# Patient Record
Sex: Female | Born: 1975 | Race: Black or African American | Hispanic: No | Marital: Single | Smoking: Current some day smoker
Health system: Southern US, Community
[De-identification: ages and names within clinical notes are randomized; demographics above are authoritative.]

## PROBLEM LIST (undated history)

## (undated) DIAGNOSIS — F32A Depression, unspecified: Secondary | ICD-10-CM

## (undated) DIAGNOSIS — F419 Anxiety disorder, unspecified: Secondary | ICD-10-CM

## (undated) DIAGNOSIS — R350 Frequency of micturition: Secondary | ICD-10-CM

## (undated) DIAGNOSIS — E049 Nontoxic goiter, unspecified: Secondary | ICD-10-CM

## (undated) DIAGNOSIS — L02415 Cutaneous abscess of right lower limb: Secondary | ICD-10-CM

## (undated) DIAGNOSIS — D649 Anemia, unspecified: Secondary | ICD-10-CM

## (undated) DIAGNOSIS — I10 Essential (primary) hypertension: Secondary | ICD-10-CM

## (undated) DIAGNOSIS — R000 Tachycardia, unspecified: Secondary | ICD-10-CM

## (undated) DIAGNOSIS — L02416 Cutaneous abscess of left lower limb: Secondary | ICD-10-CM

## (undated) DIAGNOSIS — D219 Benign neoplasm of connective and other soft tissue, unspecified: Secondary | ICD-10-CM

## (undated) HISTORY — PX: PILONIDAL CYST EXCISION: SHX744

## (undated) HISTORY — PX: BIOPSY THYROID: PRO38

---

## 1997-09-03 ENCOUNTER — Other Ambulatory Visit: Admission: RE | Admit: 1997-09-03 | Discharge: 1997-09-03 | Payer: Self-pay | Admitting: *Deleted

## 1998-01-25 ENCOUNTER — Inpatient Hospital Stay (HOSPITAL_COMMUNITY): Admission: AD | Admit: 1998-01-25 | Discharge: 1998-01-30 | Payer: Self-pay | Admitting: *Deleted

## 1998-10-04 ENCOUNTER — Ambulatory Visit (HOSPITAL_COMMUNITY): Admission: RE | Admit: 1998-10-04 | Discharge: 1998-10-04 | Payer: Self-pay | Admitting: Family Medicine

## 1998-10-05 ENCOUNTER — Encounter: Payer: Self-pay | Admitting: Family Medicine

## 1998-11-16 ENCOUNTER — Other Ambulatory Visit: Admission: RE | Admit: 1998-11-16 | Discharge: 1998-11-16 | Payer: Self-pay | Admitting: Family Medicine

## 1999-02-17 ENCOUNTER — Encounter (HOSPITAL_BASED_OUTPATIENT_CLINIC_OR_DEPARTMENT_OTHER): Payer: Self-pay | Admitting: General Surgery

## 1999-02-21 ENCOUNTER — Encounter (INDEPENDENT_AMBULATORY_CARE_PROVIDER_SITE_OTHER): Payer: Self-pay | Admitting: *Deleted

## 1999-02-21 ENCOUNTER — Ambulatory Visit (HOSPITAL_COMMUNITY): Admission: RE | Admit: 1999-02-21 | Discharge: 1999-02-21 | Payer: Self-pay | Admitting: General Surgery

## 2001-07-11 ENCOUNTER — Other Ambulatory Visit: Admission: RE | Admit: 2001-07-11 | Discharge: 2001-07-11 | Payer: Self-pay | Admitting: Family Medicine

## 2002-07-07 ENCOUNTER — Ambulatory Visit (HOSPITAL_COMMUNITY): Admission: RE | Admit: 2002-07-07 | Discharge: 2002-07-07 | Payer: Self-pay | Admitting: Obstetrics & Gynecology

## 2002-07-07 ENCOUNTER — Encounter: Payer: Self-pay | Admitting: Obstetrics & Gynecology

## 2002-09-18 ENCOUNTER — Encounter: Payer: Self-pay | Admitting: Obstetrics & Gynecology

## 2002-09-18 ENCOUNTER — Ambulatory Visit (HOSPITAL_COMMUNITY): Admission: RE | Admit: 2002-09-18 | Discharge: 2002-09-18 | Payer: Self-pay | Admitting: Obstetrics & Gynecology

## 2007-04-19 ENCOUNTER — Other Ambulatory Visit: Admission: RE | Admit: 2007-04-19 | Discharge: 2007-04-19 | Payer: Self-pay | Admitting: Family Medicine

## 2008-07-20 ENCOUNTER — Emergency Department (HOSPITAL_COMMUNITY): Admission: EM | Admit: 2008-07-20 | Discharge: 2008-07-20 | Payer: Self-pay | Admitting: Emergency Medicine

## 2008-12-28 ENCOUNTER — Emergency Department (HOSPITAL_COMMUNITY): Admission: EM | Admit: 2008-12-28 | Discharge: 2008-12-28 | Payer: Self-pay | Admitting: Emergency Medicine

## 2009-02-17 ENCOUNTER — Other Ambulatory Visit: Admission: RE | Admit: 2009-02-17 | Discharge: 2009-02-17 | Payer: Self-pay | Admitting: Family Medicine

## 2009-11-03 ENCOUNTER — Encounter: Admission: RE | Admit: 2009-11-03 | Discharge: 2009-11-03 | Payer: Self-pay | Admitting: Family Medicine

## 2009-12-31 ENCOUNTER — Emergency Department (HOSPITAL_COMMUNITY)
Admission: EM | Admit: 2009-12-31 | Discharge: 2010-01-01 | Payer: Self-pay | Source: Home / Self Care | Admitting: Emergency Medicine

## 2010-03-03 ENCOUNTER — Emergency Department (HOSPITAL_COMMUNITY)
Admission: EM | Admit: 2010-03-03 | Discharge: 2010-03-03 | Disposition: A | Discharge status: Home / Self Care | Payer: Self-pay | Attending: Emergency Medicine | Admitting: Emergency Medicine

## 2010-03-03 DIAGNOSIS — I10 Essential (primary) hypertension: Secondary | ICD-10-CM | POA: Insufficient documentation

## 2010-03-03 DIAGNOSIS — L0231 Cutaneous abscess of buttock: Secondary | ICD-10-CM | POA: Insufficient documentation

## 2010-03-10 ENCOUNTER — Emergency Department (HOSPITAL_COMMUNITY)
Admission: EM | Admit: 2010-03-10 | Discharge: 2010-03-11 | Disposition: A | Discharge status: Home / Self Care | Payer: Self-pay | Attending: Emergency Medicine | Admitting: Emergency Medicine

## 2010-03-10 DIAGNOSIS — N898 Other specified noninflammatory disorders of vagina: Secondary | ICD-10-CM | POA: Insufficient documentation

## 2010-03-10 DIAGNOSIS — N949 Unspecified condition associated with female genital organs and menstrual cycle: Secondary | ICD-10-CM | POA: Insufficient documentation

## 2010-06-10 NOTE — Op Note (Signed)
[place]  Patient:    Nancy Valenzuela                            MRN: [id number] Proc. Date: 02/21/99 Adm. Date:  [id number] Attending:  Sonda Primes CC:         Mardene Celeste. Lurene Shadow, M.D. x 2                           Operative Report  PREOPERATIVE DIAGNOSIS:  Recurrent pilonidal abscesses.  POSTOPERATIVE DIAGNOSIS:  Recurrent pilonidal abscesses.  PROCEDURE:  Excision and marsupialization of pilonidal cyst.  SURGEON:  Luisa Hart L. Lurene Shadow, M.D.  ASSISTANT:  Damita Lack, P.A. student.  ANESTHESIA:  General.  INDICATIONS:  This patient is a 35 year old woman with approximately eight recurrent pilonidal abscesses, all of which have been I&Dd.  She comes to the operating room now for excision of her pilonidal cyst.  DESCRIPTION OF PROCEDURE:  Following the induction of anesthesia, the patient was then turned into the prone position and then jack knife.  The presacral area was prepped and draped to be included in the sterile operative field after the buttocks had been spread apart.  I then made an elliptical incision around the entire pilonidal area, and carrying the dissection straight down to the presacral fascia on all sides, and then excising the entire area off of the presacral fascia and  forwarding it for pathologic evaluation.  Hemostasis was assured with electrocautery, and then buttock skin was then sutured down to the presacral fascia using interrupted 0 chromic catgut sutures.  At the end of the procedure, all areas of dissection were noted to be dry. Sponge, instrument, and sharp counts were verified.  I packed a Xeroform gauze into the  remaining defect and covered this with  sterile dressings.  The anesthetic was then reversed and the patient removed from the operating room to the recovery room in stable condition, having tolerated the procedure well. DD:  02/21/99 TD:  02/21/99 Job: [id number]

## 2010-07-24 ENCOUNTER — Emergency Department (HOSPITAL_COMMUNITY): Payer: Self-pay

## 2010-07-24 ENCOUNTER — Emergency Department (HOSPITAL_COMMUNITY)
Admission: EM | Admit: 2010-07-24 | Discharge: 2010-07-24 | Disposition: A | Discharge status: Home / Self Care | Payer: Self-pay | Attending: Emergency Medicine | Admitting: Emergency Medicine

## 2010-07-24 DIAGNOSIS — M79609 Pain in unspecified limb: Secondary | ICD-10-CM | POA: Insufficient documentation

## 2010-07-24 DIAGNOSIS — S93609A Unspecified sprain of unspecified foot, initial encounter: Secondary | ICD-10-CM | POA: Insufficient documentation

## 2010-07-24 DIAGNOSIS — IMO0002 Reserved for concepts with insufficient information to code with codable children: Secondary | ICD-10-CM | POA: Insufficient documentation

## 2010-07-24 DIAGNOSIS — X500XXA Overexertion from strenuous movement or load, initial encounter: Secondary | ICD-10-CM | POA: Insufficient documentation

## 2010-07-24 DIAGNOSIS — I10 Essential (primary) hypertension: Secondary | ICD-10-CM | POA: Insufficient documentation

## 2010-07-24 DIAGNOSIS — Y9229 Other specified public building as the place of occurrence of the external cause: Secondary | ICD-10-CM | POA: Insufficient documentation

## 2010-07-24 DIAGNOSIS — S8990XA Unspecified injury of unspecified lower leg, initial encounter: Secondary | ICD-10-CM | POA: Insufficient documentation

## 2010-07-24 DIAGNOSIS — Y9341 Activity, dancing: Secondary | ICD-10-CM | POA: Insufficient documentation

## 2010-07-24 DIAGNOSIS — Z79899 Other long term (current) drug therapy: Secondary | ICD-10-CM | POA: Insufficient documentation

## 2010-10-22 ENCOUNTER — Emergency Department (HOSPITAL_COMMUNITY)
Admission: EM | Admit: 2010-10-22 | Discharge: 2010-10-22 | Disposition: A | Discharge status: Home / Self Care | Payer: Self-pay | Attending: Emergency Medicine | Admitting: Emergency Medicine

## 2010-10-22 ENCOUNTER — Emergency Department (HOSPITAL_COMMUNITY): Payer: Self-pay

## 2010-10-22 DIAGNOSIS — Z79899 Other long term (current) drug therapy: Secondary | ICD-10-CM | POA: Insufficient documentation

## 2010-10-22 DIAGNOSIS — F17200 Nicotine dependence, unspecified, uncomplicated: Secondary | ICD-10-CM | POA: Insufficient documentation

## 2010-10-22 DIAGNOSIS — W06XXXA Fall from bed, initial encounter: Secondary | ICD-10-CM | POA: Insufficient documentation

## 2010-10-22 DIAGNOSIS — IMO0002 Reserved for concepts with insufficient information to code with codable children: Secondary | ICD-10-CM | POA: Insufficient documentation

## 2011-02-21 ENCOUNTER — Other Ambulatory Visit: Payer: Self-pay | Admitting: Family Medicine

## 2011-02-21 ENCOUNTER — Other Ambulatory Visit (HOSPITAL_COMMUNITY)
Admission: RE | Admit: 2011-02-21 | Discharge: 2011-02-21 | Disposition: A | Discharge status: Home / Self Care | Payer: Commercial Indemnity | Source: Ambulatory Visit | Attending: Family Medicine | Admitting: Family Medicine

## 2011-02-21 DIAGNOSIS — Z1159 Encounter for screening for other viral diseases: Secondary | ICD-10-CM | POA: Insufficient documentation

## 2011-02-21 DIAGNOSIS — Z01419 Encounter for gynecological examination (general) (routine) without abnormal findings: Secondary | ICD-10-CM | POA: Insufficient documentation

## 2011-03-21 ENCOUNTER — Other Ambulatory Visit: Payer: Self-pay | Admitting: Internal Medicine

## 2011-03-21 DIAGNOSIS — E041 Nontoxic single thyroid nodule: Secondary | ICD-10-CM

## 2011-03-22 ENCOUNTER — Ambulatory Visit
Admission: RE | Admit: 2011-03-22 | Discharge: 2011-03-22 | Disposition: A | Discharge status: Home / Self Care | Payer: Commercial Indemnity | Source: Ambulatory Visit | Attending: Internal Medicine | Admitting: Internal Medicine

## 2011-03-22 ENCOUNTER — Other Ambulatory Visit (HOSPITAL_COMMUNITY)
Admission: RE | Admit: 2011-03-22 | Discharge: 2011-03-22 | Disposition: A | Discharge status: Home / Self Care | Payer: Commercial Indemnity | Source: Ambulatory Visit | Attending: Interventional Radiology | Admitting: Interventional Radiology

## 2011-03-22 DIAGNOSIS — E049 Nontoxic goiter, unspecified: Secondary | ICD-10-CM | POA: Insufficient documentation

## 2011-03-22 DIAGNOSIS — E041 Nontoxic single thyroid nodule: Secondary | ICD-10-CM

## 2011-07-26 ENCOUNTER — Emergency Department (HOSPITAL_COMMUNITY)
Admission: EM | Admit: 2011-07-26 | Discharge: 2011-07-26 | Disposition: A | Discharge status: Home / Self Care | Payer: Commercial Indemnity | Source: Home / Self Care | Attending: Emergency Medicine | Admitting: Emergency Medicine

## 2011-07-26 ENCOUNTER — Encounter (HOSPITAL_COMMUNITY): Payer: Self-pay

## 2011-07-26 DIAGNOSIS — L0231 Cutaneous abscess of buttock: Secondary | ICD-10-CM

## 2011-07-26 HISTORY — DX: Essential (primary) hypertension: I10

## 2011-07-26 MED ORDER — DOXYCYCLINE HYCLATE 100 MG PO CAPS: 100.0000 mg | ORAL_CAPSULE | Freq: Two times a day (BID) | ORAL | Status: AC

## 2011-07-26 NOTE — ED Provider Notes (Addendum)
History     CSN: [id number]  Arrival date & time 07/26/11  1451   First MD Initiated Contact with Patient 07/26/11 1507      Chief Complaint  Patient presents with  . Recurrent Skin Infections    (Consider location/radiation/quality/duration/timing/severity/associated sxs/prior treatment) HPI Comments: Patient presents urgent care complaining of an ongoing boil on her left buttock since Friday. Have been applying warm compresses and: Sitz baths has not drained. It is getting bigger and more tender. She describes she has had many of this pulse before and she had on several occasion have to have been drained as well. Patient denies any systemic symptoms such as fevers, body aches arthritis or myalgias or changes in appetite.  The history is provided by the patient.    Past Medical History  Diagnosis Date  . Hypertension     Past Surgical History  Procedure Date  . Pilonidal cyst excision     History reviewed. No pertinent family history.  History  Substance Use Topics  . Smoking status: Current Everyday Smoker -- 1.0 packs/[place]  . Smokeless tobacco: Not on file  . Alcohol Use: No    OB History    Grav Para Term Preterm Abortions TAB SAB Ect Mult Living                  Review of Systems  Constitutional: Negative for fever, chills, activity change and fatigue.  Skin: Negative for color change, pallor, rash and wound.    Allergies  Review of patient's allergies indicates no known allergies.  Home Medications   Current Outpatient Rx  Name Route Sig Dispense Refill  . TRIBENZOR PO Oral Take by mouth daily.    Marland Kitchen DOXYCYCLINE HYCLATE 100 MG PO CAPS Oral Take 1 capsule (100 mg total) by mouth 2 (two) times daily. 20 capsule 0    BP 129/80  Pulse 97  Temp 98.5 F (36.9 C) (Oral)  Resp 16  SpO2 100%  LMP 07/05/2011  Physical Exam  Nursing note and vitals reviewed. Constitutional: She appears well-developed and well-nourished.  Abdominal: She exhibits no  distension. There is no tenderness.  Musculoskeletal: She exhibits tenderness.  Skin: No rash noted.       ED Course  INCISION AND DRAINAGE Performed by: Tascha Casares Authorized by: Jimmie Molly Consent: Verbal consent obtained. Consent given by: patient Patient understanding: patient states understanding of the procedure being performed Patient identity confirmed: verbally with patient Type: abscess Body area: anogenital Location details: gluteal cleft Anesthesia: local infiltration Local anesthetic: lidocaine 2% with epinephrine Anesthetic total: 6 ml Scalpel size: 15 Complexity: simple Drainage: purulent Packing material: 1/4 in iodoform gauze   (including critical care time)   Labs Reviewed  CULTURE, ROUTINE-ABSCESS   No results found.   1. Abscess of left buttock       MDM  Uncomplicated left intergluteal abscess. Incision and drainage sample obtained for cultures. Patient instructed to return in 48 hours for recheck and packing removal started on doxycycline. Patient understand treatment plans and agrees with follow-up.        Jimmie Molly, MD 07/26/11 1655   Jimmie Molly, MD 07/26/11 2016

## 2011-07-26 NOTE — ED Notes (Signed)
C/o boil to lt buttocks since Friday.  Has been using sitz baths and warm compresses.  Has not drained.

## 2011-07-28 LAB — CULTURE, ROUTINE-ABSCESS

## 2011-08-01 NOTE — ED Notes (Signed)
Abscess culture L buttocks: Few staph. species (coagulase negative).  Pt. had an I and D and Rx. of Doxycycline.  Shown to Dr. Tressia Danas and she said it was OK. Nancy Valenzuela 08/01/2011

## 2012-06-16 IMAGING — CR DG ANKLE COMPLETE 3+V*L*
3 series · 3 of 3 positions shown · non-contrast
Comparison: None.

CLINICAL DATA: Pain.  No trauma history submitted.

LEFT ANKLE COMPLETE - 3+ VIEW

[t ankle joint ap left]
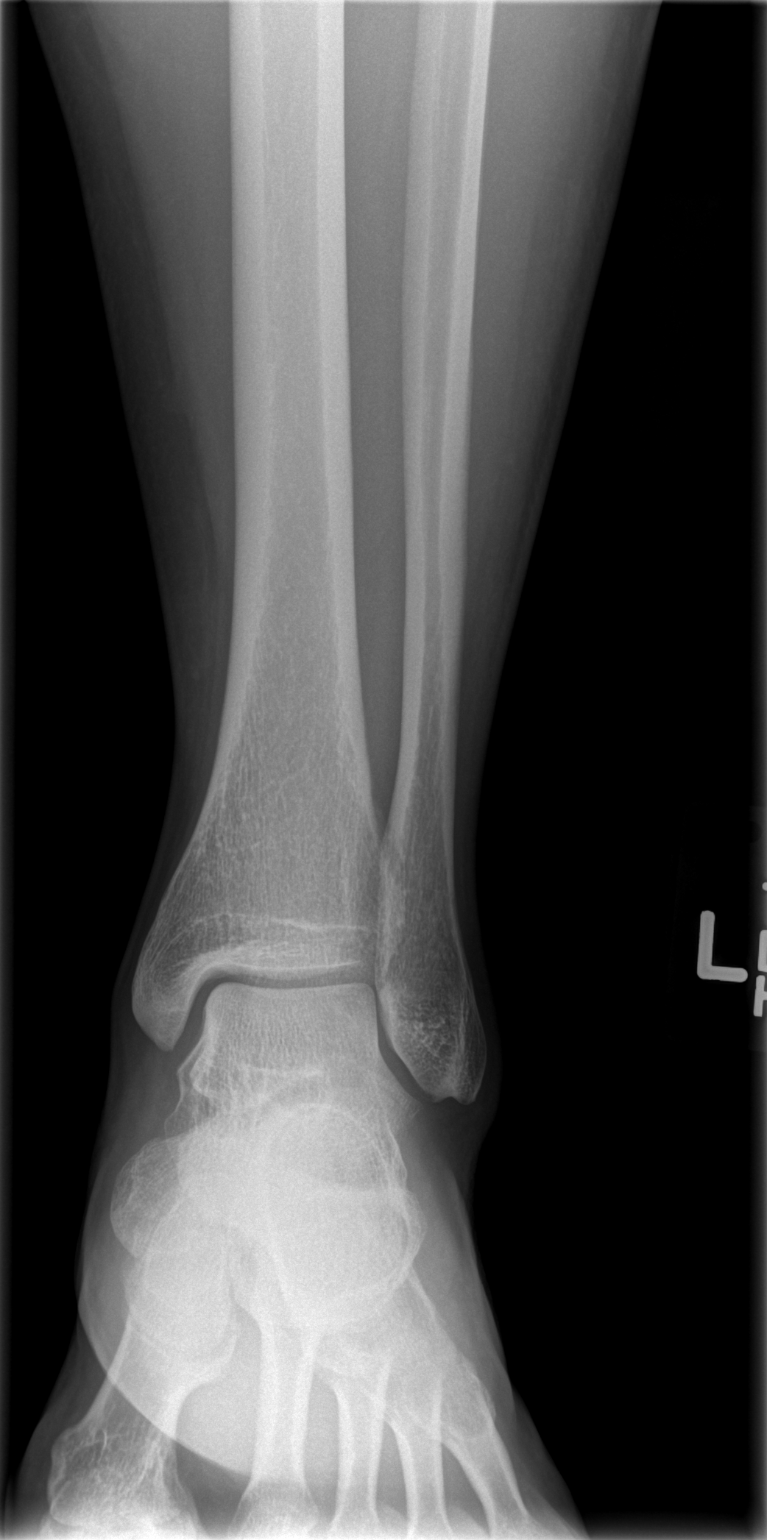

[t ankle joint oblique left]
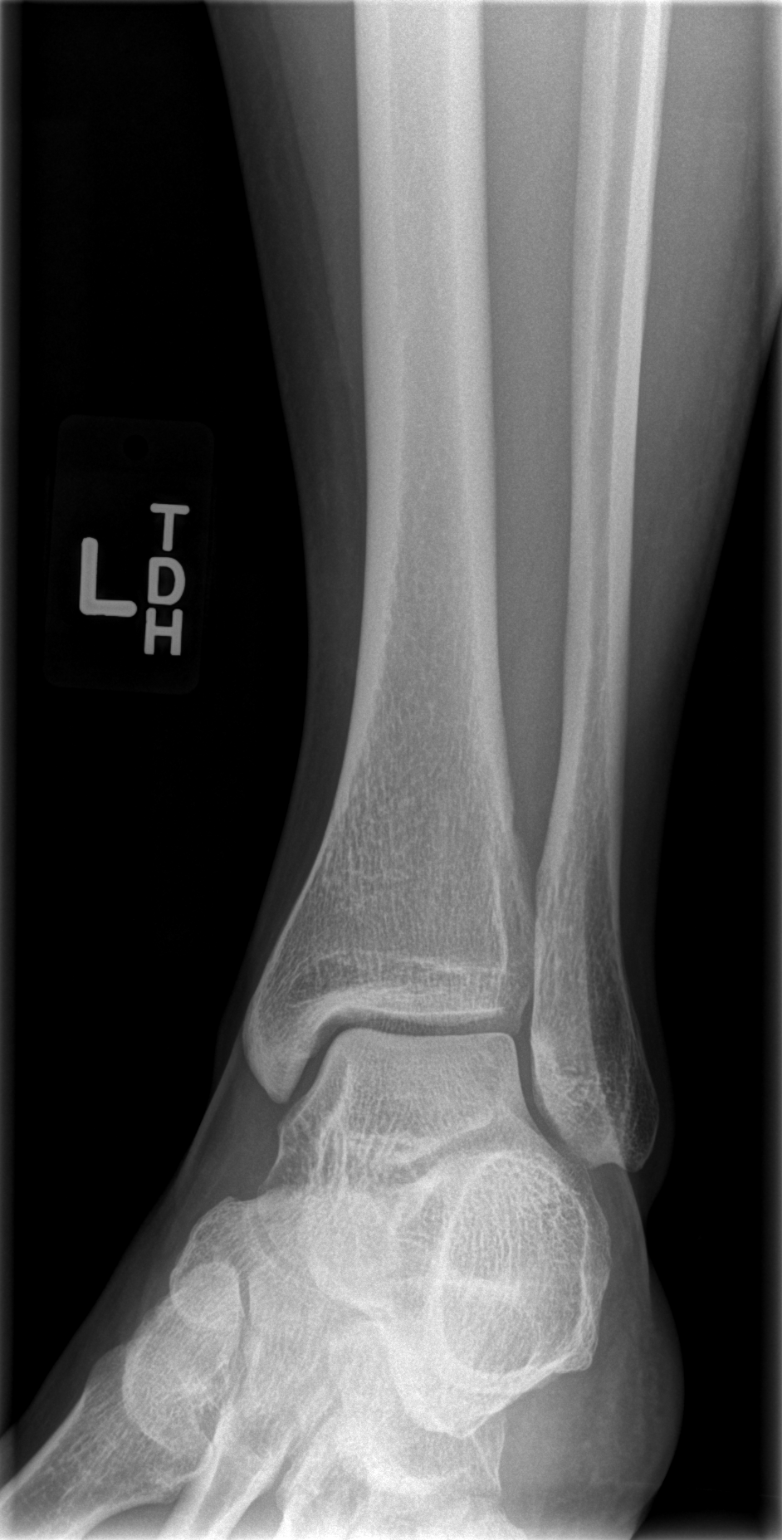

[t ankle joint lat left]
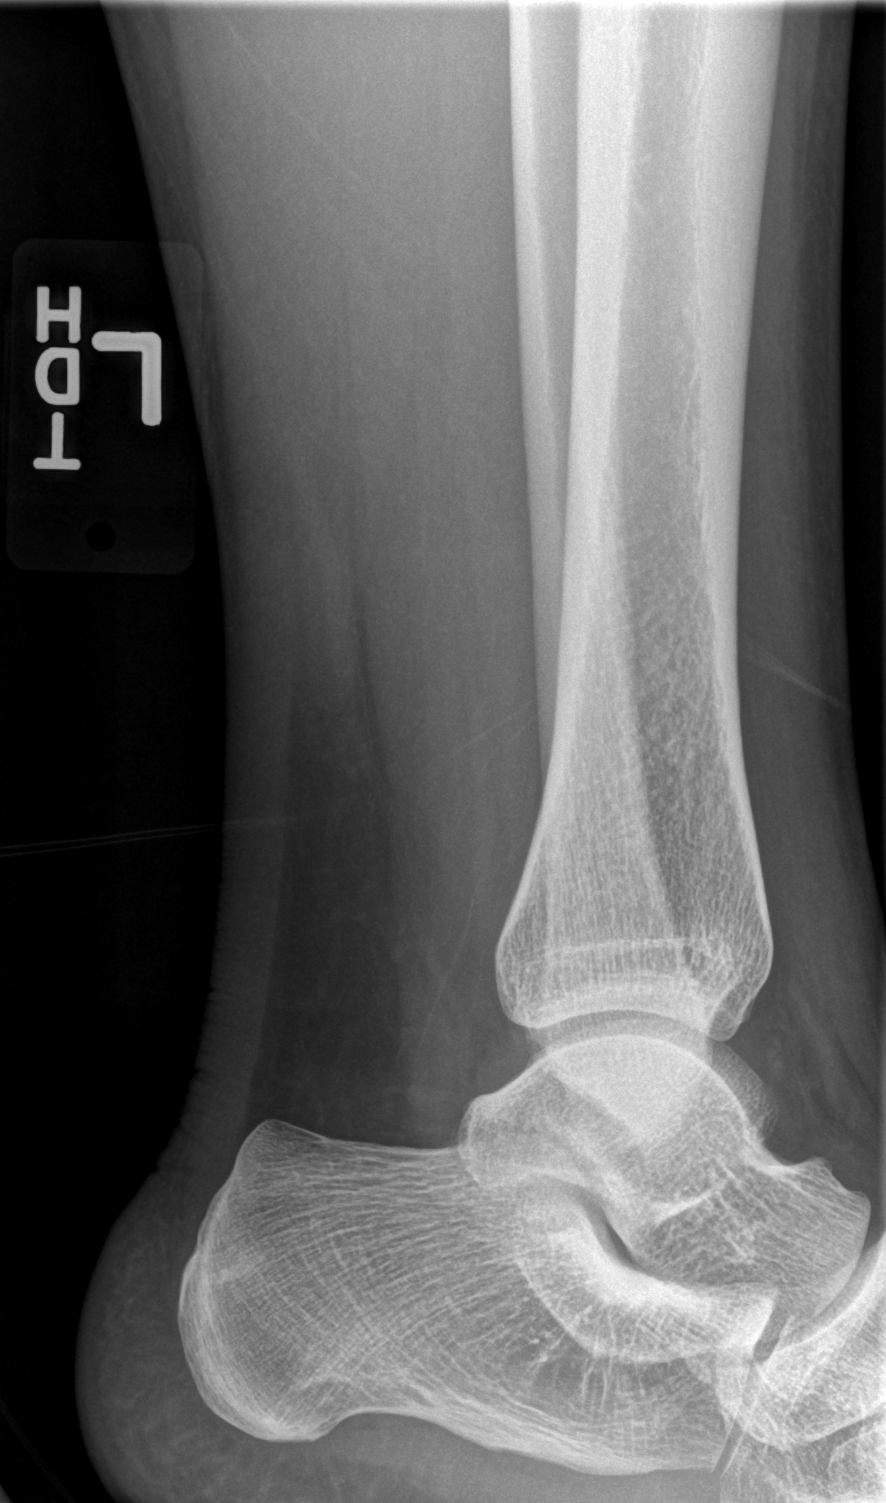

[3 of 3 positions shown; findings below may reference images not displayed]

FINDINGS: No significant soft tissue swelling.  Minimal
osteoarthritis involving the lateral aspect of the ankle. No acute
fracture or dislocation.
IMPRESSION: No acute findings about the left ankle.

## 2012-06-19 ENCOUNTER — Other Ambulatory Visit (HOSPITAL_COMMUNITY)
Admission: RE | Admit: 2012-06-19 | Discharge: 2012-06-19 | Disposition: A | Discharge status: Home / Self Care | Payer: No Typology Code available for payment source | Source: Ambulatory Visit | Attending: Family Medicine | Admitting: Family Medicine

## 2012-06-19 ENCOUNTER — Ambulatory Visit: Payer: No Typology Code available for payment source | Attending: Family Medicine | Admitting: Family Medicine

## 2012-06-19 VITALS — BP 121/87 | HR 101 | Temp 98.7°F | Resp 18 | Wt 180.6 lb

## 2012-06-19 DIAGNOSIS — N760 Acute vaginitis: Secondary | ICD-10-CM | POA: Insufficient documentation

## 2012-06-19 DIAGNOSIS — N898 Other specified noninflammatory disorders of vagina: Secondary | ICD-10-CM | POA: Insufficient documentation

## 2012-06-19 DIAGNOSIS — Z113 Encounter for screening for infections with a predominantly sexual mode of transmission: Secondary | ICD-10-CM | POA: Insufficient documentation

## 2012-06-19 DIAGNOSIS — Z7251 High risk heterosexual behavior: Secondary | ICD-10-CM

## 2012-06-19 LAB — POCT URINE PREGNANCY: Preg Test, Ur: NEGATIVE

## 2012-06-19 NOTE — Progress Notes (Signed)
Subjective:     Patient ID: Nancy Valenzuela, female   DOB: 1975/11/25, 37 y.o.   MRN: [id number]  HPI Pt here to establish care. She has several concerns today including blood pressure, vaginal discharge with odor, tobacco abuse, and the need for health maint exam.   Discussed that today we are able to address one concern and we are happy to see her next week to discuss her other concerns. She is ammenable to this and would like to deal with her vaginal discharge. She denies new sexual partners although is not 100% certain that her boyfriend has not had new partners. Therefore she would like std testing. Her dicharge has been present for a week and smells slightly fishy. She has a rash but no itching. She applied allegra cream and it burned. She has no urinary sx.    Review of Systems per hpi     Objective:   Physical Exam  Cardiovascular: Normal rate.   Pulmonary/Chest: Effort normal.  Genitourinary: Vagina normal.  Creamy d/c, no odor, no rash  no adnexal ttp, neg chandelier sign     Assessment:     Vaginal discharge - Plan: Wet prep, genital, KOH prep, GC/Chlamydia Amp Probe, Genital  High risk sexual behavior - Plan: GC/Chlamydia Amp Probe, Genital, RPR, Acute Hep Panel & Hep B Surface Ab, HIV Antibody, POCT urine pregnancy       Plan:     Will f/u labs and call in meds if indicated Will see her back in 1-2 weeks for f/u chronic probs

## 2012-06-19 NOTE — Progress Notes (Signed)
Patient here to establish care History of HTN Needs referral for pap smear

## 2012-06-19 NOTE — Progress Notes (Signed)
Patient left clinic before RPR, Acute Hep Panel & Hep B surface Ab, and HIV antibody could be drawn.  Have informed [facility] to cancel lab work.

## 2012-06-20 ENCOUNTER — Telehealth: Payer: Self-pay | Admitting: Family Medicine

## 2012-06-20 DIAGNOSIS — Z7253 High risk bisexual behavior: Secondary | ICD-10-CM | POA: Insufficient documentation

## 2012-06-20 LAB — KOH PREP: RESULT - KOH: NONE SEEN

## 2012-06-20 NOTE — Telephone Encounter (Signed)
Nancy Valenzuela from [facility] called saying that she received an order yesterday made by Dr. Lucretia Roers for this patient but that in Epic it was put in as a [facility] order and in order to process it they would need the order to be put in correctly.  She is waiting call back.  Thanks

## 2012-06-26 ENCOUNTER — Ambulatory Visit: Payer: No Typology Code available for payment source | Attending: Family Medicine | Admitting: Internal Medicine

## 2012-06-26 ENCOUNTER — Encounter: Payer: Self-pay | Admitting: Internal Medicine

## 2012-06-26 VITALS — BP 120/86 | HR 94 | Temp 98.4°F | Resp 17 | Wt 178.2 lb

## 2012-06-26 DIAGNOSIS — I10 Essential (primary) hypertension: Secondary | ICD-10-CM

## 2012-06-26 LAB — BASIC METABOLIC PANEL
CO2: 23 mEq/L (ref 19–32)
Creat: 0.62 mg/dL (ref 0.50–1.10)
Sodium: 139 mEq/L (ref 135–145)

## 2012-06-26 LAB — LIPID PANEL
LDL Cholesterol: 140 mg/dL — ABNORMAL HIGH (ref 0–99)
VLDL: 11 mg/dL (ref 0–40)

## 2012-06-26 MED ORDER — PHENTERMINE HCL 37.5 MG PO CAPS: 37.5000 mg | ORAL_CAPSULE | ORAL | Status: DC

## 2012-06-26 MED ORDER — VARENICLINE TARTRATE 1 MG PO TABS: 1.0000 mg | ORAL_TABLET | Freq: Two times a day (BID) | ORAL | Status: DC

## 2012-06-26 MED ORDER — LISINOPRIL-HYDROCHLOROTHIAZIDE 20-25 MG PO TABS: 1.0000 | ORAL_TABLET | Freq: Every day | ORAL | Status: DC

## 2012-06-26 NOTE — Patient Instructions (Signed)

## 2012-06-26 NOTE — Progress Notes (Signed)
Patient ID: Nancy Valenzuela, female   DOB: Apr 05, 1975, 37 y.o.   MRN: [id number]  CC: refill on medications  HPI: Pt is 37 yo female presents to clinic with main concern of running out the medications, needs refill on all medications. She denies chest pain or shortness of breath, no recent sicknesses or hospitalizations. She reports taking medications regularly. No abdominal or urinary concerns.  No Known Allergies Past Medical History  Diagnosis Date  . Hypertension    No current outpatient prescriptions on file prior to visit.   No current facility-administered medications on file prior to visit.   No family history on file. History   Social History  . Marital Status: Single    Spouse Name: N/A    Number of Children: N/A  . Years of Education: N/A   Occupational History  . Not on file.   Social History Main Topics  . Smoking status: Current Every [place] Smoker -- 1.00 packs/[place]  . Smokeless tobacco: Not on file  . Alcohol Use: No  . Drug Use: No  . Sexually Active:    Other Topics Concern  . Not on file   Social History Narrative  . No narrative on file    Review of Systems  Constitutional: Negative for fever, chills, diaphoresis, activity change, appetite change and fatigue.  HENT: Negative for ear pain, nosebleeds, congestion, facial swelling, rhinorrhea, neck pain, neck stiffness and ear discharge.   Eyes: Negative for pain, discharge, redness, itching and visual disturbance.  Respiratory: Negative for cough, choking, chest tightness, shortness of breath, wheezing and stridor.   Cardiovascular: Negative for chest pain, palpitations and leg swelling.  Gastrointestinal: Negative for abdominal distention.  Genitourinary: Negative for dysuria, urgency, frequency, hematuria, flank pain, decreased urine volume, difficulty urinating and dyspareunia.  Musculoskeletal: Negative for back pain, joint swelling, arthralgias and gait problem.  Neurological: Negative for dizziness,  tremors, seizures, syncope, facial asymmetry, speech difficulty, weakness, light-headedness, numbness and headaches.  Hematological: Negative for adenopathy. Does not bruise/bleed easily.  Psychiatric/Behavioral: Negative for hallucinations, behavioral problems, confusion, dysphoric mood, decreased concentration and agitation.    Objective:   Filed Vitals:   06/26/12 1006  BP: 120/86  Pulse: 94  Temp: 98.4 F (36.9 C)  Resp: 17    Physical Exam  Constitutional: Appears well-developed and well-nourished. No distress.  HENT: Normocephalic. External right and left ear normal. Oropharynx is clear and moist.  Eyes: Conjunctivae and EOM are normal. PERRLA, no scleral icterus.  Neck: Normal ROM. Neck supple. No JVD. No tracheal deviation. No thyromegaly.  CVS: RRR, S1/S2 +, no murmurs, no gallops, no carotid bruit.  Pulmonary: Effort and breath sounds normal, no stridor, rhonchi, wheezes, rales.  Abdominal: Soft. BS +,  no distension, tenderness, rebound or guarding.  Musculoskeletal: Normal range of motion. No edema and no tenderness.  Lymphadenopathy: No lymphadenopathy noted, cervical, inguinal. Neuro: Alert. Normal reflexes, muscle tone coordination. No cranial nerve deficit. Skin: Skin is warm and dry. No rash noted. Not diaphoretic. No erythema. No pallor.  Psychiatric: Normal mood and affect. Behavior, judgment, thought content normal.   No results found for this basename: WBC, HGB, HCT, MCV, PLT   No results found for this basename: CREATININE, BUN, NA, K, CL, CO2    No results found for this basename: HGBA1C   Lipid Panel  No results found for this basename: chol, trig, hdl, cholhdl, vldl, ldlcalc       Assessment and plan:   Patient Active Problem List   Diagnosis Date Noted  .  HTN 06/20/2012   - pt unable to afford current blood pressure medication - I will start Lisinopril - HCTZ today - I have advised pt to continue checking her BP regularly and to call us back  if BP > 140/90 persistently - will check BMP and lipid panel today  - follow up in 3 months

## 2012-06-26 NOTE — Progress Notes (Signed)
Patient here for blood pressure medication refill

## 2012-07-04 ENCOUNTER — Ambulatory Visit: Payer: No Typology Code available for payment source | Attending: Family Medicine | Admitting: Family Medicine

## 2012-07-04 VITALS — BP 142/92 | HR 83 | Temp 98.2°F | Resp 18 | Ht 63.19 in | Wt 181.1 lb

## 2012-07-04 DIAGNOSIS — F17200 Nicotine dependence, unspecified, uncomplicated: Secondary | ICD-10-CM

## 2012-07-04 DIAGNOSIS — B353 Tinea pedis: Secondary | ICD-10-CM | POA: Insufficient documentation

## 2012-07-04 DIAGNOSIS — Z720 Tobacco use: Secondary | ICD-10-CM

## 2012-07-04 DIAGNOSIS — Z862 Personal history of diseases of the blood and blood-forming organs and certain disorders involving the immune mechanism: Secondary | ICD-10-CM

## 2012-07-04 DIAGNOSIS — Z7251 High risk heterosexual behavior: Secondary | ICD-10-CM

## 2012-07-04 DIAGNOSIS — Z8639 Personal history of other endocrine, nutritional and metabolic disease: Secondary | ICD-10-CM

## 2012-07-04 DIAGNOSIS — Z23 Encounter for immunization: Secondary | ICD-10-CM

## 2012-07-04 DIAGNOSIS — E049 Nontoxic goiter, unspecified: Secondary | ICD-10-CM | POA: Insufficient documentation

## 2012-07-04 DIAGNOSIS — Z0000 Encounter for general adult medical examination without abnormal findings: Secondary | ICD-10-CM

## 2012-07-04 DIAGNOSIS — I10 Essential (primary) hypertension: Secondary | ICD-10-CM | POA: Insufficient documentation

## 2012-07-04 LAB — RPR

## 2012-07-04 LAB — HIV ANTIBODY (ROUTINE TESTING W REFLEX): HIV: NONREACTIVE

## 2012-07-04 MED ORDER — MICONAZOLE NITRATE 2 % EX CREA: TOPICAL_CREAM | Freq: Two times a day (BID) | CUTANEOUS | Status: DC

## 2012-07-04 NOTE — Progress Notes (Signed)
Subjective:     Patient ID: Nancy Valenzuela, female   DOB: [birth year], 37 y.o.   MRN: [id number]  HPI Pt here today for f/u from initial visit, as we were not able to address all issues that Nancy Valenzuela. Since that time she has been to the clinic once for her BP meds and was prescribed zestoretic, but has not started it yet.   1 - HTN - P 142/92 today, no headaches, blurred vision or cp, has script for zestoretic.   2 - review labs from her last 2 visits - bmp wnl, ldl high and hdl low, apparently the blood was not drawn for her STD workup so rpr/hiv/hep not resulted  3 - burning, itchy rash on b/l feet  4 - health maint - pap - due, has had abnormal pap in the past - mammo - no FH breast cancer except for grandmother, who was elderly.  - tdap - unsure when last had - flu - did not have last year - pneumovax - is a smoker, has not had shot - zostavax - not yet of age - c-scope - ho FH of early colon cancer - dexa - no h/o fractures or steroid use - vit D - none on record - BP - as above - cholesterol screen - as above - tobacco - current smoker, has script for chantix and working on being able to afford taking it. No renal, CV, mental impairment known - no etoh, no drugs  5 - pt reports a h/o goiter for which she saw endicrine several years ago. She was supposed to f/u after her treatment but never did.   Review of Systems per hpi     Objective:   Physical Exam  Nursing note and vitals reviewed. Constitutional: She appears well-developed and well-nourished.  Cardiovascular: Normal rate, regular rhythm and normal heart sounds.   Pulmonary/Chest: Effort normal and breath sounds normal.  Skin: Skin is warm and dry.  Plantar surface of both feet, slightly scaly, red, evidence of excoriation, L>R  Psychiatric: She has a normal mood and affect.       Assessment:     Tinea pedis - Plan: miconazole (MICATIN) 2 % cream  Personal history of goiter - Plan: Ambulatory referral to  Endocrinology  Essential hypertension, benign  Tobacco abuse  High risk sexual behavior - Plan: RPR, HIV Antibody, Acute Hep Panel & Hep B Surface Ab  Routine health maintenance - Plan: Tdap vaccine greater than or equal to 7yo IM       Plan:     Tinea pedis - try miconazole, if this fails to resolve the fungal infection would consider scraping for definitive typing, consider oral meds  Goiter - refer to endo  HTN - start zestoretic,  - recheck bp next week when she follows up - next week check bmp as she will have been on new med for a week  Tobacco - encouraged her to d/c smoking. Spent 3-5 mins in counselling  STD concerns - drew blood today - on return visit, will need to go GC/chlam swabs as well as wet prep along with pap  Elevated cholesterol - discuss at next visit, try diet and lifestyle changes for 6 mos. If still elevated consider statin  Heath maint - pap - pt to rtc next week, plan pap with hpv, need records regarding past abnormal paps - mammo - start at age 50 - tdap - given today, will be due in 2024 - flu - will need  in the fall - pneumovax - if does not quit smoking should discuss getting this fall.  - zostavax - at age 82 - c-scope - ate age 89 - dexa - at age 23 - screening labs to be done next week  rtc next week for pap and gc/chlam, wet prep, BP check, screening and f/u labs: cmp, tsh, cbc, vit D, a1c,   After that rtc 3 mos f/u tinea pedis, htn  Will need appt 6 mos for flp +/- OV 1 year health maint

## 2012-07-04 NOTE — Patient Instructions (Signed)
Athlete's Foot Athlete's foot (tinea pedis) is a fungal infection of the skin on the feet. It often occurs on the skin between the toes or underneath the toes. It can also occur on the soles of the feet. Athlete's foot is more likely to occur in hot, humid weather. Not washing your feet or changing your socks often enough can contribute to athlete's foot. The infection can spread from person to person (contagious). CAUSES Athlete's foot is caused by a fungus. This fungus thrives in warm, moist places. Most people get athlete's foot by sharing shower stalls, towels, and wet floors with an infected person. People with weakened immune systems, including those with diabetes, may be more likely to get athlete's foot. SYMPTOMS   Itchy areas between the toes or on the soles of the feet.  White, flaky, or scaly areas between the toes or on the soles of the feet.  Tiny, intensely itchy blisters between the toes or on the soles of the feet.  Tiny cuts on the skin. These cuts can develop a bacterial infection.  Thick or discolored toenails. DIAGNOSIS  Your caregiver can usually tell what the problem is by doing a physical exam. Your caregiver may also take a skin sample from the rash area. The skin sample may be examined under a microscope, or it may be tested to see if fungus will grow in the sample. A sample may also be taken from your toenail for testing. TREATMENT  Over-the-counter and prescription medicines can be used to kill the fungus. These medicines are available as powders or creams. Your caregiver can suggest medicines for you. Fungal infections respond slowly to treatment. You may need to continue using your medicine for several weeks. PREVENTION   Do not share towels.  Wear sandals in wet areas, such as shared locker rooms and shared showers.  Keep your feet dry. Wear shoes that allow air to circulate. Wear cotton or wool socks. HOME CARE INSTRUCTIONS   Take medicines as directed by  your caregiver. Do not use steroid creams on athlete's foot.  Keep your feet clean and cool. Wash your feet daily and dry them thoroughly, especially between your toes.  Change your socks every [place]. Wear cotton or wool socks. In hot climates, you may need to change your socks 2 to 3 times per [place].  Wear sandals or canvas tennis shoes with good air circulation.  If you have blisters, soak your feet in Burow's solution or Epsom salts for 20 to 30 minutes, 2 times a [place] to dry out the blisters. Make sure you dry your feet thoroughly afterward. SEEK MEDICAL CARE IF:   You have a fever.  You have swelling, soreness, warmth, or redness in your foot.  You are not getting better after 7 days of treatment.  You are not completely cured after 30 days.  You have any problems caused by your medicines. MAKE SURE YOU:   Understand these instructions.  Will watch your condition.  Will get help right away if you are not doing well or get worse. Document Released: 01/07/2000 Document Revised: 04/03/2011 Document Reviewed: 10/28/2010 Exit[employer] Patient Information 2014 [employer].  

## 2012-07-04 NOTE — Progress Notes (Signed)
Pt is here for a f/u w/Dr. Joseph Art and to go over recent labs Pt has multiple concerns she would like to address today w/Dr. Joseph Art She is alert and oriented w/no signs of acute distress.

## 2012-07-05 LAB — ACUTE HEP PANEL AND HEP B SURFACE AB: Hepatitis B Surface Ag: NEGATIVE

## 2012-07-08 ENCOUNTER — Other Ambulatory Visit: Payer: Self-pay | Admitting: Family Medicine

## 2012-07-08 ENCOUNTER — Ambulatory Visit: Payer: No Typology Code available for payment source

## 2012-07-08 DIAGNOSIS — B9689 Other specified bacterial agents as the cause of diseases classified elsewhere: Secondary | ICD-10-CM

## 2012-07-08 MED ORDER — METRONIDAZOLE 500 MG PO TABS: 500.0000 mg | ORAL_TABLET | Freq: Two times a day (BID) | ORAL | Status: DC

## 2012-07-09 ENCOUNTER — Ambulatory Visit: Payer: No Typology Code available for payment source

## 2012-07-10 ENCOUNTER — Ambulatory Visit: Payer: No Typology Code available for payment source | Attending: Family Medicine

## 2012-07-10 ENCOUNTER — Telehealth: Payer: Self-pay | Admitting: *Deleted

## 2012-07-10 NOTE — Telephone Encounter (Signed)
07/10/12  Patient presented to clinic today made aware that Her prescription was called into [facility]  On [address] per pt. Request. P.Tiger Spieker,RN BSN [facility]

## 2012-07-10 NOTE — Telephone Encounter (Signed)
07/10/12 Prescription called into pharmacy for Flagyl per Dr. Lucretia Roers at  [facility] [phone].  Patient made aware.  P.Chauna Osoria,RN BSN MHA

## 2012-07-10 NOTE — Telephone Encounter (Signed)
07/10/12 Patient made aware that her HIV, RPR, Hepatitis test all negative. P.Makennah Omura,RN BSN  MHA

## 2012-07-17 ENCOUNTER — Ambulatory Visit: Payer: No Typology Code available for payment source

## 2012-08-26 ENCOUNTER — Ambulatory Visit: Payer: No Typology Code available for payment source | Attending: Family Medicine | Admitting: Internal Medicine

## 2012-08-26 MED ORDER — LISINOPRIL-HYDROCHLOROTHIAZIDE 20-25 MG PO TABS: 1.0000 | ORAL_TABLET | Freq: Every day | ORAL | Status: DC

## 2012-08-26 NOTE — Progress Notes (Unsigned)
Patient here for follow up of blood pressure

## 2012-08-26 NOTE — Progress Notes (Unsigned)
Patient ID: Nancy Valenzuela, female   DOB: Jun 13, 1975, 37 y.o.   MRN: [id number]  CC:  HPI:  37 year old female seen for followup of her hypertension. The patient's blood pressure is fairly well-controlled. She denies any chest pain any shortness of breath. The patient is requesting Chantix to help her quit smoking. She was prescribed this by Dr. Izola Price on 6/4 but has not been able to refill this medication for some reason.     No Known Allergies Past Medical History  Diagnosis Date  . Hypertension    Current Outpatient Prescriptions on File Prior to Visit  Medication Sig Dispense Refill  . metroNIDAZOLE (FLAGYL) 500 MG tablet Take 1 tablet (500 mg total) by mouth 2 (two) times daily with a meal.  14 tablet  0  . miconazole (MICATIN) 2 % cream Apply topically 2 (two) times daily.  85 g  1  . phentermine 37.5 MG capsule Take 1 capsule (37.5 mg total) by mouth every morning.  30 capsule  5  . varenicline (CHANTIX) 1 MG tablet Take 1 tablet (1 mg total) by mouth 2 (two) times daily.  60 tablet  3   No current facility-administered medications on file prior to visit.   History reviewed. No pertinent family history. History   Social History  . Marital Status: Single    Spouse Name: N/A    Number of Children: N/A  . Years of Education: N/A   Occupational History  . Not on file.   Social History Main Topics  . Smoking status: Current Every [place] Smoker -- 1.00 packs/[place]  . Smokeless tobacco: Not on file  . Alcohol Use: No  . Drug Use: No  . Sexually Active:    Other Topics Concern  . Not on file   Social History Narrative  . No narrative on file    Review of Systems  Constitutional: Negative for fever, chills, diaphoresis, activity change, appetite change and fatigue.  HENT: Negative for ear pain, nosebleeds, congestion, facial swelling, rhinorrhea, neck pain, neck stiffness and ear discharge.   Eyes: Negative for pain, discharge, redness, itching and visual disturbance.   Respiratory: Negative for cough, choking, chest tightness, shortness of breath, wheezing and stridor.   Cardiovascular: Negative for chest pain, palpitations and leg swelling.  Gastrointestinal: Negative for abdominal distention.  Genitourinary: Negative for dysuria, urgency, frequency, hematuria, flank pain, decreased urine volume, difficulty urinating and dyspareunia.  Musculoskeletal: Negative for back pain, joint swelling, arthralgias and gait problem.  Neurological: Negative for dizziness, tremors, seizures, syncope, facial asymmetry, speech difficulty, weakness, light-headedness, numbness and headaches.  Hematological: Negative for adenopathy. Does not bruise/bleed easily.  Psychiatric/Behavioral: Negative for hallucinations, behavioral problems, confusion, dysphoric mood, decreased concentration and agitation.    Objective:   Filed Vitals:   08/26/12 1003  BP: 129/86  Pulse: 95  Temp: 97 F (36.1 C)  Resp: 16    Physical Exam  Constitutional: Appears well-developed and well-nourished. No distress.  HENT: Normocephalic. External right and left ear normal. Oropharynx is clear and moist.  Eyes: Conjunctivae and EOM are normal. PERRLA, no scleral icterus.  Neck: Normal ROM. Neck supple. No JVD. No tracheal deviation. No thyromegaly.  CVS: RRR, S1/S2 +, no murmurs, no gallops, no carotid bruit.  Pulmonary: Effort and breath sounds normal, no stridor, rhonchi, wheezes, rales.  Abdominal: Soft. BS +,  no distension, tenderness, rebound or guarding.  Musculoskeletal: Normal range of motion. No edema and no tenderness.  Lymphadenopathy: No lymphadenopathy noted, cervical, inguinal. Neuro: Alert. Normal reflexes,  muscle tone coordination. No cranial nerve deficit. Skin: Skin is warm and dry. No rash noted. Not diaphoretic. No erythema. No pallor.  Psychiatric: Normal mood and affect. Behavior, judgment, thought content normal.   No results found for this basename: WBC, HGB, HCT,  MCV, PLT   Lab Results  Component Value Date   CREATININE 0.62 06/26/2012   BUN 7 06/26/2012   NA 139 06/26/2012   K 4.0 06/26/2012   CL 105 06/26/2012   CO2 23 06/26/2012    No results found for this basename: HGBA1C   Lipid Panel     Component Value Date/Time   CHOL 187 06/26/2012 1014   TRIG 55 06/26/2012 1014   HDL 36* 06/26/2012 1014   CHOLHDL 5.2 06/26/2012 1014   VLDL 11 06/26/2012 1014   LDLCALC 140* 06/26/2012 1014       Assessment and plan:   There are no active problems to display for this patient.  Nicotine dependence Patient continues to smoke a pack a Humphries Has been prescribed Chantix and is willing to try despite her history of depression I offered her a nicotine patch but she declined   Hypertension continue antihypertensive regimen

## 2012-09-17 ENCOUNTER — Encounter (HOSPITAL_COMMUNITY): Payer: Self-pay | Admitting: *Deleted

## 2012-09-17 ENCOUNTER — Emergency Department (HOSPITAL_COMMUNITY)
Admission: EM | Admit: 2012-09-17 | Discharge: 2012-09-17 | Disposition: A | Discharge status: Home / Self Care | Payer: No Typology Code available for payment source | Source: Home / Self Care

## 2012-09-17 DIAGNOSIS — L0291 Cutaneous abscess, unspecified: Secondary | ICD-10-CM

## 2012-09-17 NOTE — ED Provider Notes (Signed)
Nancy Valenzuela is a 37 y.o. female who presents to Urgent Care today for abscess of the right buttock occurring over the last several days. Patient has a tender cyst that is draining a bit of pus. This is worsened over the last Jemison. No fevers or chills present. Consistent with prior episodes of abscess. Feels well otherwise. Has tried over-the-counter pain medications which have not helped.    PMH reviewed. History of abscess in the past History  Substance Use Topics  . Smoking status: Current Every [place] Smoker -- 1.00 packs/[place]  . Smokeless tobacco: Not on file  . Alcohol Use: No   ROS as above Medications reviewed. No current facility-administered medications for this encounter.   Current Outpatient Prescriptions  Medication Sig Dispense Refill  . lisinopril-hydrochlorothiazide (PRINZIDE,ZESTORETIC) 20-25 MG per tablet Take 1 tablet by mouth daily.  90 tablet  3  . phentermine 37.5 MG capsule Take 1 capsule (37.5 mg total) by mouth every morning.  30 capsule  5  . varenicline (CHANTIX) 1 MG tablet Take 1 tablet (1 mg total) by mouth 2 (two) times daily.  60 tablet  3    Exam:  BP 138/95  Pulse 110  Temp(Src) 98.3 F (36.8 C) (Oral)  Resp 20  SpO2 100%  LMP 09/17/2012 Gen: Well NAD SKIN: Small area of erythema and induration with tiny area of fluctuance in the center on the right upper thigh, posterior aspect.  Small amount of pus is expressible.  Tender to touch.   Procedure note abscess I&D:  Consent obtained and timeout performed.  Skin cleaned with alcohol and 2 mL of 2% lidocaine with epinephrine injected overlying the abscess.  The skin was then re\re cleaned with alcohol and a sharp scalpel was used to incise the abscess. Pus was expressed and cultured.  The wound was treated at a loculations broken up with a wooden Q-tip.  The wound was packed with 1 inch of quarter inch packing material. Patient tolerated the procedure well  No results found for this or any previous  visit (from the past 24 hour(s)). No results found.  Assessment and Plan: 37 y.o. female with right posterior upper thigh abscess incision and drainage.  Culture pending.  Remove packing in one or 2 days.  Followup as needed Discussed warning signs or symptoms. Please see discharge instructions. Patient expresses understanding.      Rodolph Bong, MD 09/17/12 [phone]

## 2012-09-17 NOTE — ED Notes (Signed)
Pt  Has  A  History  Of  Boils    She has  A  Boil  On the  Back of  Her  r  Upper  Thigh   For   Several  Days  It is  Tender  To the  Touch

## 2012-09-20 LAB — CULTURE, ROUTINE-ABSCESS

## 2012-09-29 ENCOUNTER — Encounter (HOSPITAL_COMMUNITY): Payer: Self-pay

## 2012-09-29 ENCOUNTER — Emergency Department (INDEPENDENT_AMBULATORY_CARE_PROVIDER_SITE_OTHER): Payer: No Typology Code available for payment source

## 2012-09-29 ENCOUNTER — Emergency Department (HOSPITAL_COMMUNITY)
Admission: EM | Admit: 2012-09-29 | Discharge: 2012-09-29 | Disposition: A | Discharge status: Home / Self Care | Payer: No Typology Code available for payment source | Source: Home / Self Care

## 2012-09-29 DIAGNOSIS — S92919A Unspecified fracture of unspecified toe(s), initial encounter for closed fracture: Secondary | ICD-10-CM

## 2012-09-29 DIAGNOSIS — S92911A Unspecified fracture of right toe(s), initial encounter for closed fracture: Secondary | ICD-10-CM

## 2012-09-29 MED ORDER — HYDROCODONE-ACETAMINOPHEN 5-325 MG PO TABS: 1.0000 | ORAL_TABLET | ORAL | Status: DC | PRN

## 2012-09-29 NOTE — ED Provider Notes (Signed)
Medical screening examination/treatment/procedure(s) were performed by resident physician or non-physician practitioner and as supervising physician I was immediately available for consultation/collaboration.   Lavaeh Bau DOUGLAS MD.   Audyn Dimercurio D Saffron Busey, MD 09/29/12 1602 

## 2012-09-29 NOTE — ED Provider Notes (Signed)
CSN: [id number]     Arrival date & time 09/29/12  1247 History   First MD Initiated Contact with Patient 09/29/12 1311     Chief Complaint  Patient presents with  . Foot Injury   (Consider location/radiation/quality/duration/timing/severity/associated sxs/prior Treatment) HPI Comments: Slipped and struck the right fifth digit against the wall 3 days ago.  Patient is a 37 y.o. female presenting with foot injury. The history is provided by the patient.  Foot Injury Location:  Toe Time since incident:  3 days Injury: yes   Toe location:  R little toe Pain details:    Quality:  Aching and throbbing   Radiates to:  Does not radiate   Severity:  Moderate   Timing:  Constant   Progression:  Worsening Chronicity:  New Prior injury to area:  No Relieved by:  Nothing Associated symptoms: no fever     Past Medical History  Diagnosis Date  . Hypertension    Past Surgical History  Procedure Laterality Date  . Pilonidal cyst excision    . Cesarean section     History reviewed. No pertinent family history. History  Substance Use Topics  . Smoking status: Current Every [place] Smoker -- 1.00 packs/[place]  . Smokeless tobacco: Not on file  . Alcohol Use: No   OB History   Grav Para Term Preterm Abortions TAB SAB Ect Mult Living                 Review of Systems  Constitutional: Positive for activity change. Negative for fever and chills.  HENT: Negative.   Respiratory: Negative.   Cardiovascular: Negative.   Gastrointestinal: Negative.   Musculoskeletal:       As per HPI  Skin: Negative for color change, pallor and rash.  Neurological: Negative.     Allergies  Review of patient's allergies indicates no known allergies.  Home Medications   Current Outpatient Rx  Name  Route  Sig  Dispense  Refill  . lisinopril-hydrochlorothiazide (PRINZIDE,ZESTORETIC) 20-25 MG per tablet   Oral   Take 1 tablet by mouth daily.   90 tablet   3   . phentermine 37.5 MG capsule   Oral  Take 1 capsule (37.5 mg total) by mouth every morning.   30 capsule   5   . HYDROcodone-acetaminophen (NORCO/VICODIN) 5-325 MG per tablet   Oral   Take 1 tablet by mouth every 4 (four) hours as needed for pain.   15 tablet   0   . varenicline (CHANTIX) 1 MG tablet   Oral   Take 1 tablet (1 mg total) by mouth 2 (two) times daily.   60 tablet   3    BP 133/94  Pulse 86  Temp(Src) 98.3 F (36.8 C) (Oral)  Resp 18  SpO2 98%  LMP 09/17/2012 Physical Exam  Nursing note and vitals reviewed. Constitutional: She is oriented to person, place, and time. She appears well-developed and well-nourished. No distress.  HENT:  Head: Normocephalic and atraumatic.  Eyes: EOM are normal. Pupils are equal, round, and reactive to light.  Neck: Normal range of motion. Neck supple.  Musculoskeletal: She exhibits edema and tenderness.  echymosis to 5th digit, mild swelling, marked tenderness.  Lymphadenopathy:    She has no cervical adenopathy.  Neurological: She is alert and oriented to person, place, and time. No cranial nerve deficit.  Skin: Skin is warm and dry.  Psychiatric: She has a normal mood and affect.    ED Course  Procedures (including  critical care time) Labs Review Labs Reviewed - No data to display Imaging Review Dg Foot Complete Right  09/29/2012   *RADIOLOGY REPORT*  Clinical Data: History of trauma with pain in the fifth toe.  RIGHT FOOT COMPLETE - 3+ VIEW  Comparison: No priors.  Findings: Three views of the right foot demonstrate an acute oblique fracture through the base of the fifth proximal phalanx. This does not appear to extend to the articular surface at the fifth MTP joint.  No additional acute fracture is identified.  Soft tissues overlying the fracture are swollen.  IMPRESSION: 1.  Acute nondisplaced fracture through the base of the fifth proximal phalanx.   Original Report Authenticated By: Trudie Reed, M.D.    MDM   1. Fracture of toe of right foot, closed,  initial encounter       t Buddy tape toes and a hard sole shoe  Elevation, ice   Norco 5 mg for pain Limit activity that flexes the toes.    Hayden Rasmussen, NP 09/29/12 1351

## 2012-09-29 NOTE — ED Notes (Signed)
Injury to right foot 9-5; pain , swelling, deformity?, ecchymosis on inspection

## 2012-10-18 ENCOUNTER — Ambulatory Visit: Payer: No Typology Code available for payment source | Attending: Internal Medicine | Admitting: Internal Medicine

## 2012-10-18 VITALS — BP 113/78 | HR 73 | Temp 98.3°F | Ht 63.0 in | Wt 170.4 lb

## 2012-10-18 DIAGNOSIS — Z23 Encounter for immunization: Secondary | ICD-10-CM

## 2012-10-18 DIAGNOSIS — F17200 Nicotine dependence, unspecified, uncomplicated: Secondary | ICD-10-CM | POA: Insufficient documentation

## 2012-10-18 DIAGNOSIS — I10 Essential (primary) hypertension: Secondary | ICD-10-CM | POA: Insufficient documentation

## 2012-10-18 LAB — TSH: TSH: 0.773 u[IU]/mL (ref 0.350–4.500)

## 2012-10-18 LAB — CBC WITH DIFFERENTIAL/PLATELET
Basophils Relative: 0 % (ref 0–1)
Eosinophils Absolute: 0.2 10*3/uL (ref 0.0–0.7)
HCT: 39.2 % (ref 36.0–46.0)
Hemoglobin: 13 g/dL (ref 12.0–15.0)
Lymphs Abs: 4.1 10*3/uL — ABNORMAL HIGH (ref 0.7–4.0)
MCH: 25.7 pg — ABNORMAL LOW (ref 26.0–34.0)
MCHC: 33.2 g/dL (ref 30.0–36.0)
MCV: 77.6 fL — ABNORMAL LOW (ref 78.0–100.0)
Monocytes Absolute: 0.8 10*3/uL (ref 0.1–1.0)
Monocytes Relative: 8 % (ref 3–12)
Neutrophils Relative %: 52 % (ref 43–77)
RBC: 5.05 MIL/uL (ref 3.87–5.11)

## 2012-10-18 LAB — BASIC METABOLIC PANEL
BUN: 13 mg/dL (ref 6–23)
CO2: 27 mEq/L (ref 19–32)
Calcium: 9.5 mg/dL (ref 8.4–10.5)
Glucose, Bld: 104 mg/dL — ABNORMAL HIGH (ref 70–99)
Sodium: 137 mEq/L (ref 135–145)

## 2012-10-18 LAB — LIPID PANEL
Total CHOL/HDL Ratio: 4.1 Ratio
VLDL: 9 mg/dL (ref 0–40)

## 2012-10-18 MED ORDER — VARENICLINE TARTRATE 1 MG PO TABS: 1.0000 mg | ORAL_TABLET | Freq: Two times a day (BID) | ORAL | Status: DC

## 2012-10-18 MED ORDER — LISINOPRIL-HYDROCHLOROTHIAZIDE 20-25 MG PO TABS: 1.0000 | ORAL_TABLET | Freq: Every day | ORAL | Status: DC

## 2012-10-18 MED ORDER — PHENTERMINE HCL 37.5 MG PO CAPS: 37.5000 mg | ORAL_CAPSULE | ORAL | Status: DC

## 2012-10-18 NOTE — Progress Notes (Signed)
Patient ID: Nancy Valenzuela, female   DOB: 1975/02/27, 37 y.o.   MRN: [id number]  CC: Refill on blood pressure medicine  HPI: Patient is 37 year old female who presents to clinic requiring refill on blood pressure medicine. She reports feeling well, denies chest pain or shortness of breath, no abdominal or urinary concerns, reports compliance with medications.  No Known Allergies Past Medical History  Diagnosis Date  . Hypertension    No current outpatient prescriptions on file prior to visit.   No current facility-administered medications on file prior to visit.   No known family medical history History   Social History  . Marital Status: Single    Spouse Name: N/A    Number of Children: N/A  . Years of Education: N/A   Occupational History  . Not on file.   Social History Main Topics  . Smoking status: Current Every [place] Smoker -- 1.00 packs/[place]  . Smokeless tobacco: Not on file  . Alcohol Use: No  . Drug Use: No  . Sexual Activity:    Other Topics Concern  . Not on file   Social History Narrative  . No narrative on file    Review of Systems  Constitutional: Negative for fever, chills, diaphoresis, activity change, appetite change and fatigue.  HENT: Negative for ear pain, nosebleeds, congestion, facial swelling, rhinorrhea, neck pain, neck stiffness and ear discharge.   Eyes: Negative for pain, discharge, redness, itching and visual disturbance.  Respiratory: Negative for cough, choking, chest tightness, shortness of breath, wheezing and stridor.   Cardiovascular: Negative for chest pain, palpitations and leg swelling.  Gastrointestinal: Negative for abdominal distention.  Genitourinary: Negative for dysuria, urgency, frequency, hematuria, flank pain, decreased urine volume, difficulty urinating and dyspareunia.  Musculoskeletal: Negative for back pain, joint swelling, arthralgias and gait problem.  Neurological: Negative for dizziness, tremors, seizures, syncope, facial  asymmetry, speech difficulty, weakness, light-headedness, numbness and headaches.  Hematological: Negative for adenopathy. Does not bruise/bleed easily.  Psychiatric/Behavioral: Negative for hallucinations, behavioral problems, confusion, dysphoric mood, decreased concentration and agitation.    Objective:   Filed Vitals:   10/18/12 1015  BP: 113/78  Pulse: 73  Temp: 98.3 F (36.8 C)    Physical Exam  Constitutional: Appears well-developed and well-nourished. No distress.  HENT: Normocephalic. External right and left ear normal. Oropharynx is clear and moist.  Eyes: Conjunctivae and EOM are normal. PERRLA, no scleral icterus.  Neck: Normal ROM. Neck supple. No JVD. No tracheal deviation. No thyromegaly.  CVS: RRR, S1/S2 +, no murmurs, no gallops, no carotid bruit.  Pulmonary: Effort and breath sounds normal, no stridor, rhonchi, wheezes, rales.  Abdominal: Soft. BS +,  no distension, tenderness, rebound or guarding.    No results found for this basename: WBC, HGB, HCT, MCV, PLT   Lab Results  Component Value Date   CREATININE 0.62 06/26/2012   BUN 7 06/26/2012   NA 139 06/26/2012   K 4.0 06/26/2012   CL 105 06/26/2012   CO2 23 06/26/2012    No results found for this basename: HGBA1C   Lipid Panel     Component Value Date/Time   CHOL 187 06/26/2012 1014   TRIG 55 06/26/2012 1014   HDL 36* 06/26/2012 1014   CHOLHDL 5.2 06/26/2012 1014   VLDL 11 06/26/2012 1014   LDLCALC 140* 06/26/2012 1014       Assessment and plan:   Hypertension - blood pressure appears to be stable and well controlled, we'll continue current medical regimen and provide refill. Will  check electrolyte panel today, lipid panel, TSH and will let patient know if any changes in medical regimen are indicated. Tobacco use - we have discussed cessation, will provide Chantix

## 2012-10-18 NOTE — Patient Instructions (Signed)
Varenicline oral tablets What is this medicine? VARENICLINE (var EN i kleen) is used to help people quit smoking. It can reduce the symptoms caused by stopping smoking. It is used with a patient support program recommended by your physician. This medicine may be used for other purposes; ask your health care provider or pharmacist if you have questions. What should I tell my health care provider before I take this medicine? They need to know if you have any of these conditions: -bipolar disorder, depression, schizophrenia or other mental illness -heart disease -kidney disease -peripheral vascular disease -stroke -suicidal thoughts, plans, or attempt; a previous suicide attempt by you or a family member -an unusual or allergic reaction to varenicline, other medicines, foods, dyes, or preservatives -pregnant or trying to get pregnant -breast-feeding How should I use this medicine? You should set a date to stop smoking and tell your doctor. Start this medicine one week before the quit date. You can also start taking this medicine before you choose a quit date, and then pick a quit date that is between 8 and 35 days of treatment with this medicine. Stick to your plan; ask about support groups or other ways to help you remain a 'quitter'. Take this medicine by mouth after eating. Take with a full glass of water. Follow the directions on the prescription label. Take your doses at regular intervals. Do not take your medicine more often than directed. A special MedGuide will be given to you by the pharmacist with each prescription and refill. Be sure to read this information carefully each time. Talk to your pediatrician regarding the use of this medicine in children. This medicine is not approved for use in children. Overdosage: If you think you have taken too much of this medicine contact a poison control center or emergency room at once. NOTE: This medicine is only for you. Do not share this medicine  with others. What if I miss a dose? If you miss a dose, take it as soon as you can. If it is almost time for your next dose, take only that dose. Do not take double or extra doses. What may interact with this medicine? -insulin -other stop smoking aids -theophylline -warfarin This list may not describe all possible interactions. Give your health care provider a list of all the medicines, herbs, non-prescription drugs, or dietary supplements you use. Also tell them if you smoke, drink alcohol, or use illegal drugs. Some items may interact with your medicine. What should I watch for while using this medicine? Visit your doctor or health care professional for regular check ups. Ask for ongoing advice and encouragement from your doctor or healthcare professional, friends, and family to help you quit. If you smoke while on this medication, quit again Your mouth may get dry. Chewing sugarless gum or sucking hard candy, and drinking plenty of water may help. Contact your doctor if the problem does not go away or is severe. You may get drowsy or dizzy. Do not drive, use machinery, or do anything that needs mental alertness until you know how this medicine affects you. Do not stand or sit up quickly, especially if you are an older patient. This reduces the risk of dizzy or fainting spells. The use of this medicine may increase the chance of suicidal thoughts or actions. Pay special attention to how you are responding while on this medicine. Any worsening of mood, or thoughts of suicide or dying should be reported to your health care professional right away.  What side effects may I notice from receiving this medicine? Side effects that you should report to your doctor or health care professional as soon as possible: -allergic reactions like skin rash, itching or hives, swelling of the face, lips, tongue, or throat -breathing problems -changes in vision -chest pain or chest tightness -confusion, trouble  speaking or understanding -fast, irregular heartbeat -feeling faint or lightheaded, falls -fever -pain in legs when walking -problems with balance, talking, walking -ringing in ears -sudden numbness or weakness of the face, arm or leg -suicidal thoughts or other mood changes -trouble passing urine or change in the amount of urine -unusual bleeding or bruising -unusually weak or tired Side effects that usually do not require medical attention (report to your doctor or health care professional if they continue or are bothersome): -constipation -headache -nausea, vomiting -strange dreams -stomach gas -trouble sleeping This list may not describe all possible side effects. Call your doctor for medical advice about side effects. You may report side effects to FDA at 1-800-FDA-1088. Where should I keep my medicine? Keep out of the reach of children. Store at room temperature between 15 and 30 degrees C (59 and 86 degrees F). Throw away any unused medicine after the expiration date. NOTE: This sheet is a summary. It may not cover all possible information. If you have questions about this medicine, talk to your doctor, pharmacist, or health care provider.  2013, [employer]. (08/16/2009 3:12:38 PM)

## 2012-10-28 ENCOUNTER — Ambulatory Visit: Payer: No Typology Code available for payment source | Attending: Internal Medicine

## 2013-01-12 ENCOUNTER — Encounter (HOSPITAL_COMMUNITY): Payer: Self-pay | Admitting: Emergency Medicine

## 2013-01-12 ENCOUNTER — Emergency Department (INDEPENDENT_AMBULATORY_CARE_PROVIDER_SITE_OTHER)
Admission: EM | Admit: 2013-01-12 | Discharge: 2013-01-12 | Disposition: A | Discharge status: Home / Self Care | Payer: PRIVATE HEALTH INSURANCE | Source: Home / Self Care | Attending: Family Medicine | Admitting: Family Medicine

## 2013-01-12 DIAGNOSIS — S90221S Contusion of right lesser toe(s) with damage to nail, sequela: Secondary | ICD-10-CM

## 2013-01-12 DIAGNOSIS — IMO0002 Reserved for concepts with insufficient information to code with codable children: Secondary | ICD-10-CM

## 2013-01-12 DIAGNOSIS — L723 Sebaceous cyst: Secondary | ICD-10-CM

## 2013-01-12 DIAGNOSIS — L089 Local infection of the skin and subcutaneous tissue, unspecified: Secondary | ICD-10-CM

## 2013-01-12 HISTORY — DX: Cutaneous abscess of right lower limb: L02.416

## 2013-01-12 HISTORY — DX: Cutaneous abscess of right lower limb: L02.415

## 2013-01-12 NOTE — ED Notes (Signed)
I&D abdominal abscess per Dr. Denyse Amass with assistance.  No packing placed.

## 2013-01-12 NOTE — ED Notes (Signed)
Started with small abscess to lower abd 4 days ago.  Has frequent abscesses in groin area.  Also states has right 5th toe fracture from 9 wks ago.  Noticed dark discoloration to toenail; concerned about color.

## 2013-01-12 NOTE — ED Provider Notes (Signed)
Nancy Valenzuela is a 37 y.o. female who presents to Urgent Care today for  1) right fifth toe toenail black: Patient broke her right fifth toe about 9 weeks ago. She notes that her toenail of his toe is black. No tenderness no pain no fevers or chills. She is walking normally now. She is worried that her toe may fell off. 2) abscess of the skin of the abdominal wall near the old C-section scar. This is been present for the last several days. It is tender to touch. No fevers or chills. No treatments tried.   Past Medical History  Diagnosis Date  . Hypertension   . Multiple abscesses of both legs    History  Substance Use Topics  . Smoking status: Current Every [place] Smoker -- 1.00 packs/[place]  . Smokeless tobacco: Not on file  . Alcohol Use: No   ROS as above Medications reviewed. No current facility-administered medications for this encounter.   Current Outpatient Prescriptions  Medication Sig Dispense Refill  . lisinopril-hydrochlorothiazide (PRINZIDE,ZESTORETIC) 20-25 MG per tablet Take 1 tablet by mouth daily.  90 tablet  7  . phentermine 37.5 MG capsule Take 1 capsule (37.5 mg total) by mouth every morning.  30 capsule  5  . varenicline (CHANTIX) 1 MG tablet Take 1 tablet (1 mg total) by mouth 2 (two) times daily.  60 tablet  3    Exam:  BP 129/86  Pulse 97  Temp(Src) 98.6 F (37 C) (Oral)  Resp 18  SpO2 97%  LMP 01/06/2013 Gen: Well NAD SKIN: Small fluctuant tender area with surrounding induration and erythema along the old mature C-section scar on the central abdominal wall skin.  Right fifth toe: Toenail black nontender. Toe itself normal-appearing with great capillary refill sensation and motion.  Abscess incision and drainage:  Consent obtained and timeout performed. Skin cleaned with alcohol and cold spray applied and then  2 mL of lidocaine with epinephrine were injected overlying the abscess. 11 blade scalpel was used to cut into the abscess. Pus was expressed and  cultured. The incision was widened and forceps used to remove some of the cyst wall. The wound was then packed. Patient tolerated the procedure well   Assessment and Plan: 37 y.o. female with  1) toe: Old mature subungual hematoma. Advised patient that her toenail may fall off but her toe will be just fine. 2) infected sebaceous cyst. Near the C-section scar. Incised and drained today. Culture pending. No antibiotics needed. Followup as needed. Discussed warning signs or symptoms. Please see discharge instructions. Patient expresses understanding.      Rodolph Bong, MD 01/12/13 1700

## 2013-01-16 LAB — CULTURE, ROUTINE-ABSCESS
Culture: NO GROWTH
Special Requests: NORMAL

## 2013-02-12 IMAGING — US US SOFT TISSUE HEAD/NECK
1 series · 14 of 25 positions shown · non-contrast
Comparison: None.

CLINICAL DATA: Probable nodule

THYROID ULTRASOUND
TECHNIQUE: Ultrasound examination of the thyroid gland and adjacent
soft tissues was performed.

[Series 1: us soft tissue head/neck · 0.08mm/px · 14 of 57 slices shown]
[im 1/57]
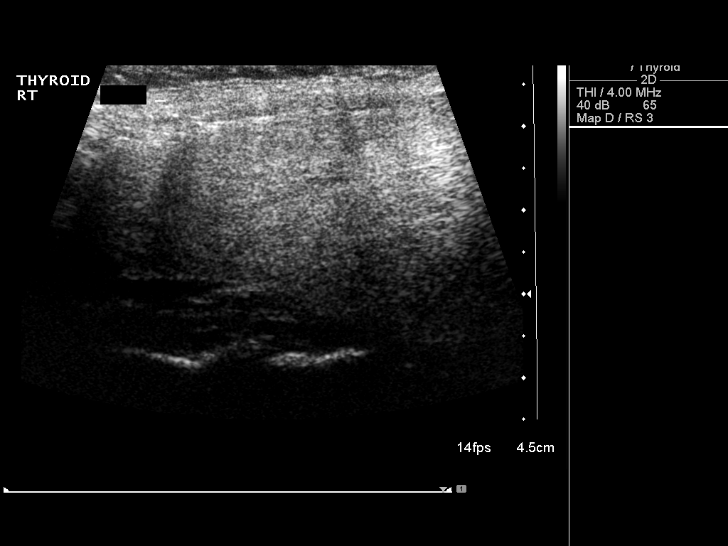
[im 5/57]
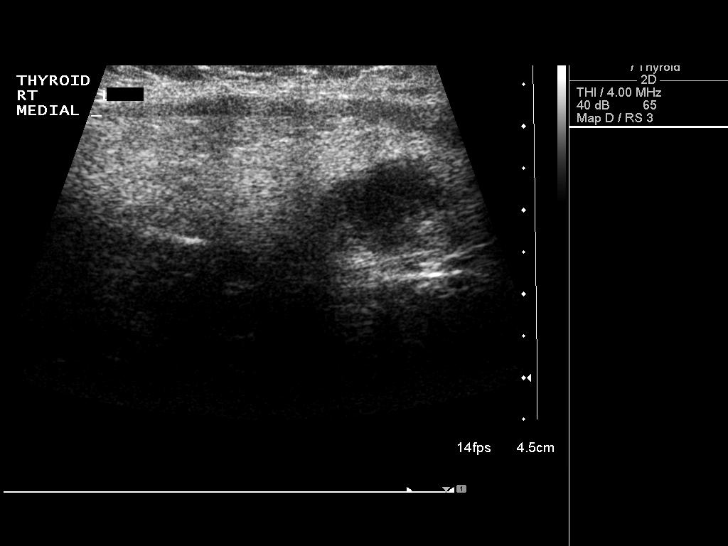
[im 10/57]
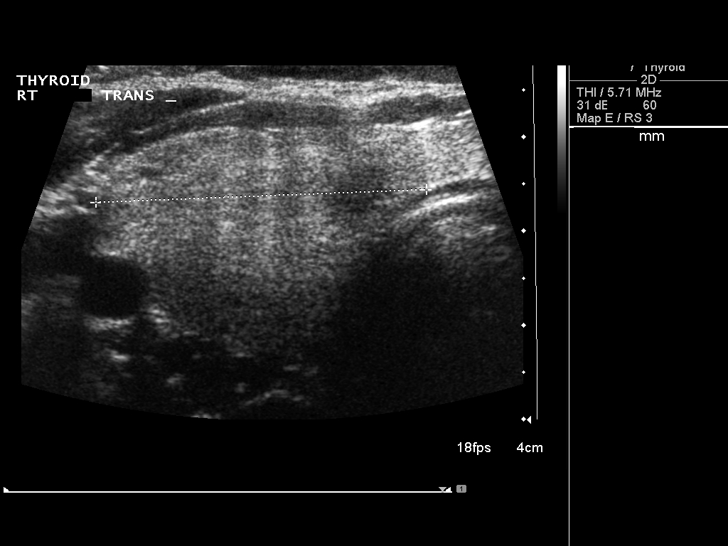
[im 15/57]
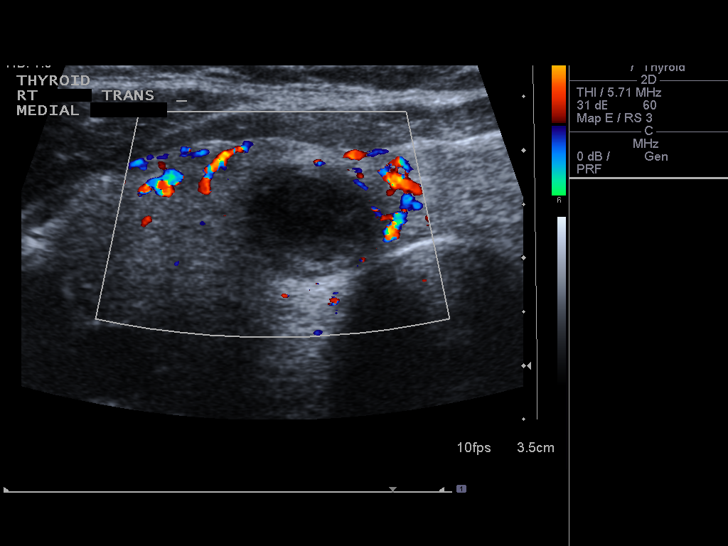
[im 19/57]
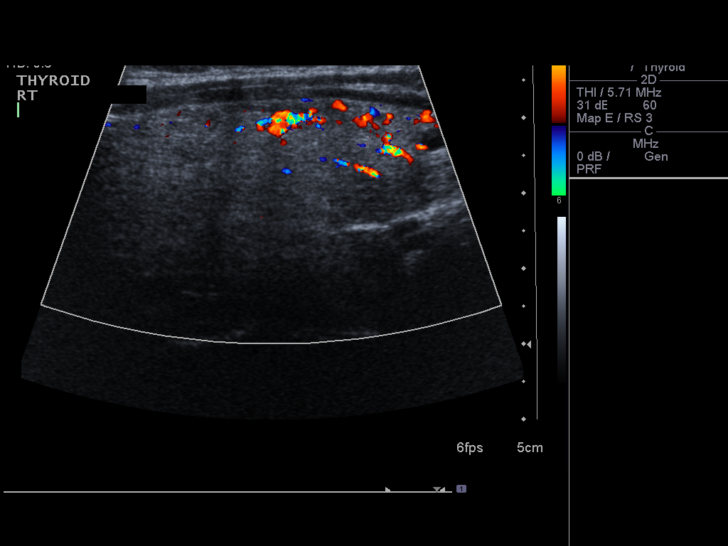
[im 22/57]
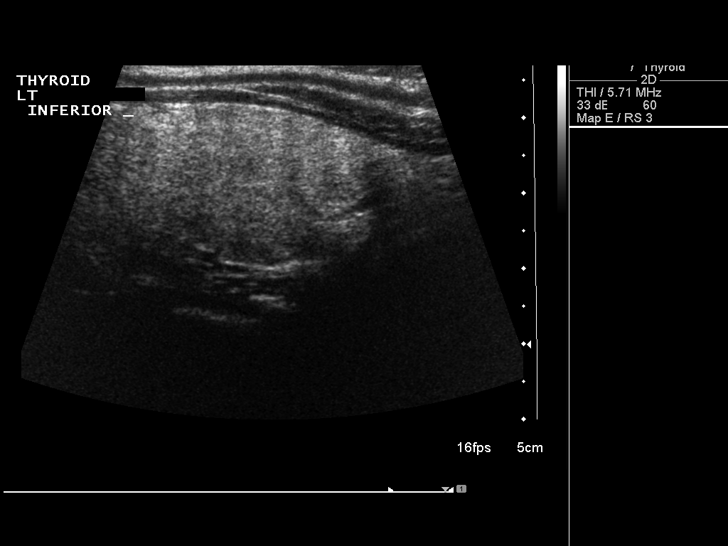
[im 26/57]
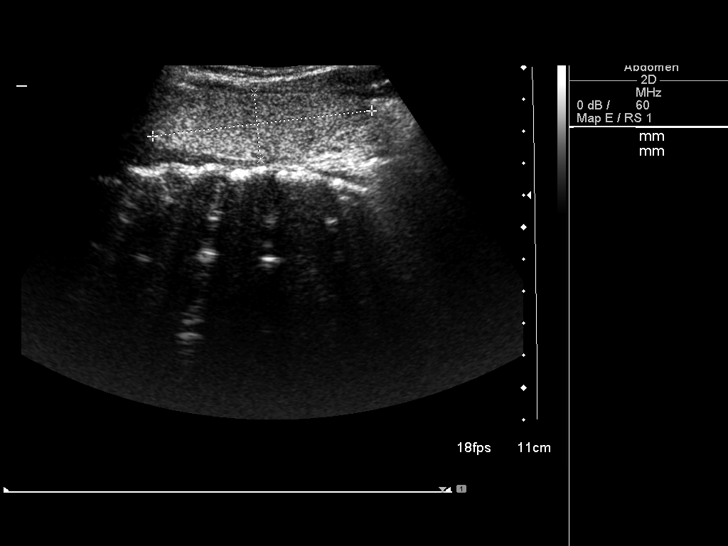
[im 31/57]
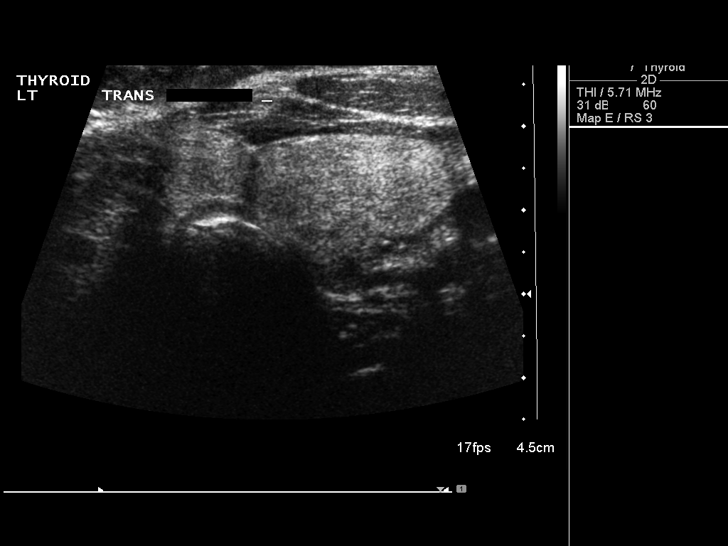
[im 36/57]
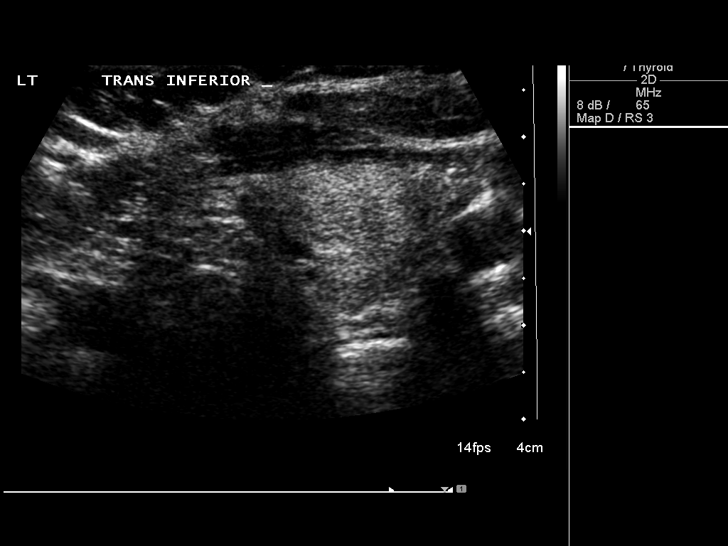
[im 38/57]
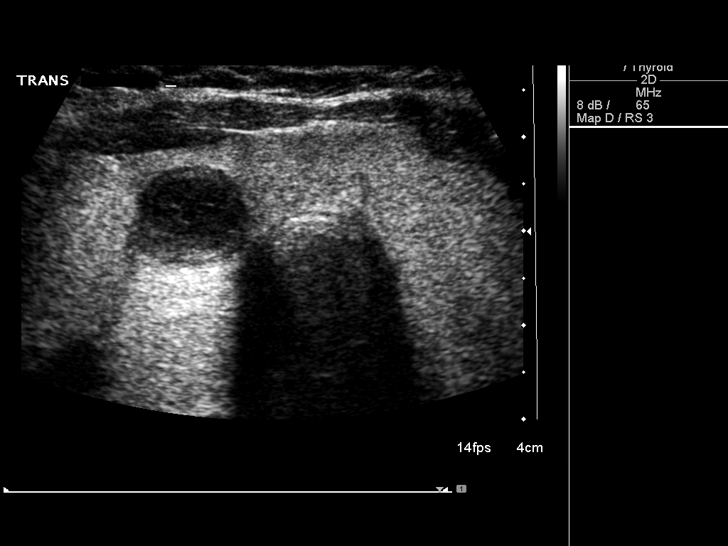
[im 43/57]
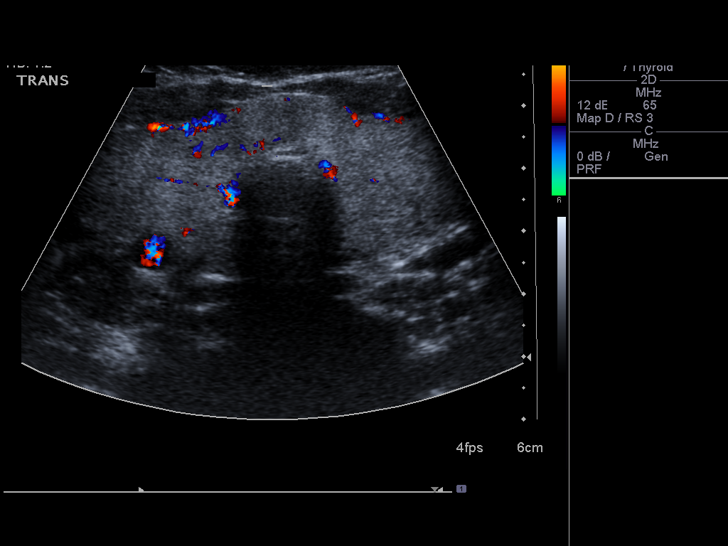
[im 47/57]
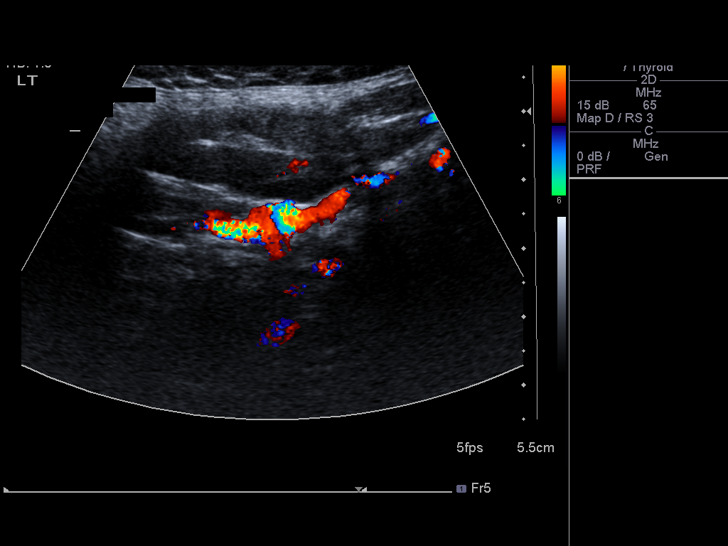
[im 52/57]
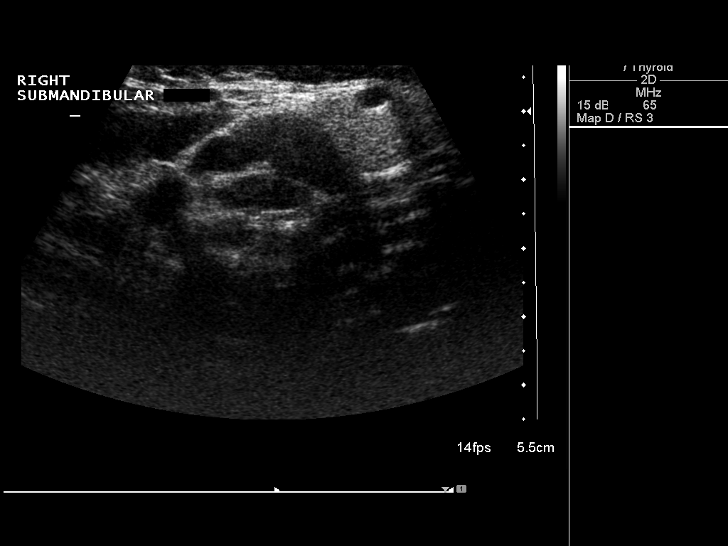
[im 57/57]
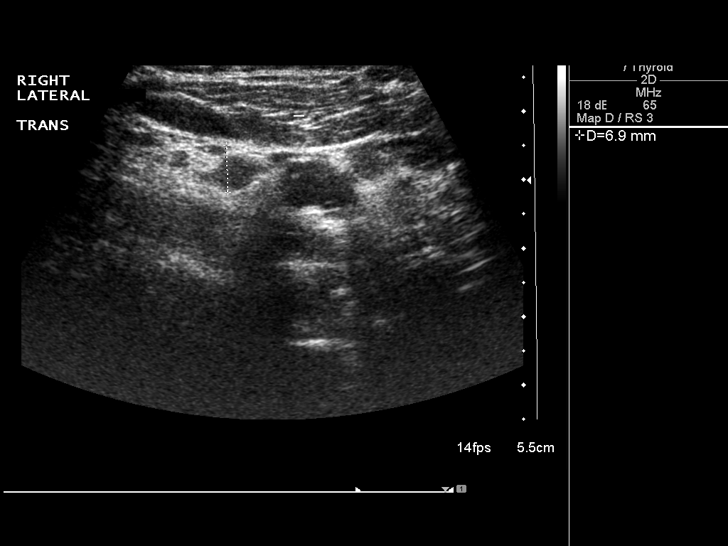

[14 of 25 positions shown; findings below may reference images not displayed]

FINDINGS: Right thyroid lobe:  7.2 x 2.2 x 3.5 cm.
Left thyroid lobe:  6.9 x 2.1 x 2.8 cm.
Isthmus:  9 mm.

Focal nodules:  Thyroid echotexture is homogeneous.  Nodules are
present.  1.5 x 1.1 x 1.3 cm heterogeneous non just to the right of
the isthmus.  6 x 3 x 6 mm nodule in the lower pole of the right
lobe.

Lymphadenopathy:  14 mm short axis diameter left submandibular
lymph node.
IMPRESSION: Dominant right thyroid nodule.  Biopsy to follow.

## 2013-02-20 ENCOUNTER — Ambulatory Visit: Payer: PRIVATE HEALTH INSURANCE | Attending: Internal Medicine | Admitting: Internal Medicine

## 2013-02-20 ENCOUNTER — Encounter: Payer: Self-pay | Admitting: Internal Medicine

## 2013-02-20 VITALS — BP 128/86 | HR 98 | Temp 98.6°F | Resp 16 | Ht 63.0 in | Wt 175.0 lb

## 2013-02-20 DIAGNOSIS — I10 Essential (primary) hypertension: Secondary | ICD-10-CM | POA: Insufficient documentation

## 2013-02-20 DIAGNOSIS — N760 Acute vaginitis: Secondary | ICD-10-CM | POA: Insufficient documentation

## 2013-02-20 LAB — POCT URINALYSIS DIPSTICK
BILIRUBIN UA: NEGATIVE
Glucose, UA: NEGATIVE
KETONES UA: NEGATIVE
Nitrite, UA: NEGATIVE
PH UA: 6.5
PROTEIN UA: NEGATIVE
SPEC GRAV UA: 1.015
Urobilinogen, UA: 0.2

## 2013-02-20 LAB — CBC WITH DIFFERENTIAL/PLATELET
BASOS PCT: 1 % (ref 0–1)
Basophils Absolute: 0.1 10*3/uL (ref 0.0–0.1)
EOS ABS: 0.2 10*3/uL (ref 0.0–0.7)
Eosinophils Relative: 2 % (ref 0–5)
HEMATOCRIT: 38.1 % (ref 36.0–46.0)
HEMOGLOBIN: 12.6 g/dL (ref 12.0–15.0)
LYMPHS ABS: 4.1 10*3/uL — AB (ref 0.7–4.0)
Lymphocytes Relative: 37 % (ref 12–46)
MCH: 24.6 pg — AB (ref 26.0–34.0)
MCHC: 33.1 g/dL (ref 30.0–36.0)
MCV: 74.4 fL — AB (ref 78.0–100.0)
MONO ABS: 0.9 10*3/uL (ref 0.1–1.0)
MONOS PCT: 8 % (ref 3–12)
Neutro Abs: 5.7 10*3/uL (ref 1.7–7.7)
Neutrophils Relative %: 52 % (ref 43–77)
Platelets: 399 10*3/uL (ref 150–400)
RBC: 5.12 MIL/uL — AB (ref 3.87–5.11)
RDW: 15.6 % — ABNORMAL HIGH (ref 11.5–15.5)
WBC: 10.9 10*3/uL — ABNORMAL HIGH (ref 4.0–10.5)

## 2013-02-20 LAB — COMPLETE METABOLIC PANEL WITH GFR
ALBUMIN: 4.4 g/dL (ref 3.5–5.2)
ALK PHOS: 63 U/L (ref 39–117)
ALT: 13 U/L (ref 0–35)
AST: 15 U/L (ref 0–37)
BUN: 10 mg/dL (ref 6–23)
CO2: 28 mEq/L (ref 19–32)
Calcium: 9.5 mg/dL (ref 8.4–10.5)
Chloride: 101 mEq/L (ref 96–112)
Creat: 0.67 mg/dL (ref 0.50–1.10)
GFR, Est African American: >89 mL/min
GLUCOSE: 92 mg/dL (ref 70–99)
POTASSIUM: 4.6 meq/L (ref 3.5–5.3)
SODIUM: 135 meq/L (ref 135–145)
TOTAL PROTEIN: 7.1 g/dL (ref 6.0–8.3)
Total Bilirubin: 0.3 mg/dL (ref 0.2–1.2)

## 2013-02-20 MED ORDER — FLUCONAZOLE 150 MG PO TABS: 150.0000 mg | ORAL_TABLET | Freq: Once | ORAL | Status: DC

## 2013-02-20 MED ORDER — CIPROFLOXACIN HCL 500 MG PO TABS: 500.0000 mg | ORAL_TABLET | Freq: Two times a day (BID) | ORAL | Status: DC

## 2013-02-20 MED ORDER — FLUCONAZOLE 150 MG PO TABS: 150.0000 mg | ORAL_TABLET | Freq: Every morning | ORAL | Status: DC

## 2013-02-20 MED ORDER — METRONIDAZOLE 500 MG PO TABS: 500.0000 mg | ORAL_TABLET | Freq: Three times a day (TID) | ORAL | Status: DC

## 2013-02-20 MED ORDER — LISINOPRIL-HYDROCHLOROTHIAZIDE 20-25 MG PO TABS: 1.0000 | ORAL_TABLET | Freq: Every day | ORAL | Status: DC

## 2013-02-20 NOTE — Addendum Note (Signed)
Addended by: Allyson Sabal MD, Ascencion Dike on: 02/20/2013 10:56 AM   Modules accepted: Orders

## 2013-02-20 NOTE — Progress Notes (Signed)
Pt is here following up on her HTN. Pt reports having a yeast infection. She is having a light yellow discharge.

## 2013-02-20 NOTE — Addendum Note (Signed)
Addended by: Allyson Sabal MD, Ascencion Dike on: 02/20/2013 10:29 AM   Modules accepted: Orders, Medications

## 2013-02-20 NOTE — Progress Notes (Signed)
Patient ID: Nancy Valenzuela, female   DOB: Dec 27, 1975, 38 y.o.   MRN: [id number]   CC:  HPI: 38 year old female here with concerns about non-odorous discharge, after unprotected sex 2 weeks ago. She say that she had a negative HIV test in August. She also had a Pap smear that was negative. She denies any fever, dysuria, flank pain,   She also needs a refill on her antihypertensive medication   No Known Allergies Past Medical History  Diagnosis Date  . Hypertension   . Multiple abscesses of both legs    Current Outpatient Prescriptions on File Prior to Visit  Medication Sig Dispense Refill  . phentermine 37.5 MG capsule Take 1 capsule (37.5 mg total) by mouth every morning.  30 capsule  5  . varenicline (CHANTIX) 1 MG tablet Take 1 tablet (1 mg total) by mouth 2 (two) times daily.  60 tablet  3   No current facility-administered medications on file prior to visit.   History reviewed. No pertinent family history. History   Social History  . Marital Status: Single    Spouse Name: N/A    Number of Children: N/A  . Years of Education: N/A   Occupational History  . Not on file.   Social History Main Topics  . Smoking status: Current Every Sabas Smoker -- 1.00 packs/[place]  . Smokeless tobacco: Not on file  . Alcohol Use: No  . Drug Use: No  . Sexual Activity: Not on file   Other Topics Concern  . Not on file   Social History Narrative  . No narrative on file    Review of Systems  Constitutional: Negative for fever, chills, diaphoresis, activity change, appetite change and fatigue.  HENT: Negative for ear pain, nosebleeds, congestion, facial swelling, rhinorrhea, neck pain, neck stiffness and ear discharge.   Eyes: Negative for pain, discharge, redness, itching and visual disturbance.  Respiratory: Negative for cough, choking, chest tightness, shortness of breath, wheezing and stridor.   Cardiovascular: Negative for chest pain, palpitations and leg swelling.  Gastrointestinal:  Negative for abdominal distention.  Genitourinary: As in history of present illness Musculoskeletal: Negative for back pain, joint swelling, arthralgias and gait problem.  Neurological: Negative for dizziness, tremors, seizures, syncope, facial asymmetry, speech difficulty, weakness, light-headedness, numbness and headaches.  Hematological: Negative for adenopathy. Does not bruise/bleed easily.  Psychiatric/Behavioral: Negative for hallucinations, behavioral problems, confusion, dysphoric mood, decreased concentration and agitation.    Objective:   Filed Vitals:   02/20/13 1007  BP: 128/86  Pulse: 98  Temp: 98.6 F (37 C)  Resp: 16    Physical Exam  Constitutional: Appears well-developed and well-nourished. No distress.  HENT: Normocephalic. External right and left ear normal. Oropharynx is clear and moist.  Eyes: Conjunctivae and EOM are normal. PERRLA, no scleral icterus.  Neck: Normal ROM. Neck supple. No JVD. No tracheal deviation. No thyromegaly.  CVS: RRR, S1/S2 +, no murmurs, no gallops, no carotid bruit.  Pulmonary: Effort and breath sounds normal, no stridor, rhonchi, wheezes, rales.  Abdominal: Soft. BS +,  no distension, tenderness, rebound or guarding.  Musculoskeletal: Normal range of motion. No edema and no tenderness.  Lymphadenopathy: No lymphadenopathy noted, cervical, inguinal. Neuro: Alert. Normal reflexes, muscle tone coordination. No cranial nerve deficit. Skin: Skin is warm and dry. No rash noted. Not diaphoretic. No erythema. No pallor.  Psychiatric: Normal mood and affect. Behavior, judgment, thought content normal.   Lab Results  Component Value Date   WBC 10.9* 10/18/2012   HGB 13.0  10/18/2012   HCT 39.2 10/18/2012   MCV 77.6* 10/18/2012   PLT 392 10/18/2012   Lab Results  Component Value Date   CREATININE 0.61 10/18/2012   BUN 13 10/18/2012   NA 137 10/18/2012   K 4.6 10/18/2012   CL 103 10/18/2012   CO2 27 10/18/2012    No results found for this  basename: HGBA1C   Lipid Panel     Component Value Date/Time   CHOL 159 10/18/2012 1034   TRIG 43 10/18/2012 1034   HDL 39* 10/18/2012 1034   CHOLHDL 4.1 10/18/2012 1034   VLDL 9 10/18/2012 1034   LDLCALC 111* 10/18/2012 1034       Assessment and plan:   There are no active problems to display for this patient.      Vaginitis Rule out chlamydia/gonorrhea Empirically treat with Flagyl and Diflucan Patient are interested in being checked for HIV   Hypertension check renal panel Refill zestoric  Followup in 3 months    The patient was given clear instructions to go to ER or return to medical center if symptoms don't improve, worsen or new problems develop. The patient verbalized understanding. The patient was told to call to get any lab results if not heard anything in the next week.

## 2013-02-20 NOTE — Addendum Note (Signed)
Addended by: Allyson Sabal MD, Ascencion Dike on: 02/20/2013 10:41 AM   Modules accepted: Orders

## 2013-02-21 ENCOUNTER — Telehealth: Payer: Self-pay | Admitting: *Deleted

## 2013-02-21 LAB — GC/CHLAMYDIA PROBE AMP, URINE
CHLAMYDIA, SWAB/URINE, PCR: NEGATIVE
GC Probe Amp, Urine: NEGATIVE

## 2013-02-21 LAB — TSH: TSH: 0.671 u[IU]/mL (ref 0.350–4.500)

## 2013-02-21 MED ORDER — FERROUS SULFATE 300 (60 FE) MG/5ML PO SYRP: 325.0000 mg | ORAL_SOLUTION | Freq: Every day | ORAL | Status: DC

## 2013-02-21 NOTE — Telephone Encounter (Signed)
Pt complains that Dr. Allyson Sabal did not help her with her main complaint of a vaginal issue. Pt also complains that Dr. Allyson Sabal did not listen to her and solve her issue. Results were given to the pt and the pt was eventually satisfied as the results were explained.

## 2013-02-21 NOTE — Telephone Encounter (Signed)
Message copied by Dannilynn Gallina, [place] R on Fri Feb 21, 2013  2:29 PM ------      Message from: Allyson Sabal MD, [facility]      Created: Fri Feb 21, 2013 11:05 AM       Patient most likely has mild iron deficiency anemia, please call in a prescription for ferrous sulfate 325 mg, 30 tablets with 6 refills ------

## 2013-03-06 ENCOUNTER — Ambulatory Visit: Payer: Self-pay

## 2013-03-26 ENCOUNTER — Ambulatory Visit: Payer: Self-pay

## 2013-04-16 ENCOUNTER — Ambulatory Visit: Payer: PRIVATE HEALTH INSURANCE | Attending: Internal Medicine

## 2013-05-12 ENCOUNTER — Encounter (HOSPITAL_COMMUNITY): Payer: Self-pay | Admitting: Emergency Medicine

## 2013-05-12 ENCOUNTER — Emergency Department (HOSPITAL_COMMUNITY)
Admission: EM | Admit: 2013-05-12 | Discharge: 2013-05-12 | Disposition: A | Discharge status: Home / Self Care | Payer: PRIVATE HEALTH INSURANCE | Source: Home / Self Care | Attending: Family Medicine | Admitting: Family Medicine

## 2013-05-12 DIAGNOSIS — B354 Tinea corporis: Secondary | ICD-10-CM

## 2013-05-12 DIAGNOSIS — H6590 Unspecified nonsuppurative otitis media, unspecified ear: Secondary | ICD-10-CM

## 2013-05-12 MED ORDER — FLUTICASONE PROPIONATE 50 MCG/ACT NA SUSP: 2.0000 | Freq: Every day | NASAL | Status: DC

## 2013-05-12 MED ORDER — TERBINAFINE HCL 1 % EX CREA: 1.0000 "application " | TOPICAL_CREAM | Freq: Two times a day (BID) | CUTANEOUS | Status: DC

## 2013-05-12 NOTE — ED Notes (Signed)
Pt reports     Symptoms  Of  Rash    On  Fingers        -     Pt reports    possible fungus  l  Foot  As  Well          Pt also reports    Sensation   Of  Pressure l ear   As well       Pt  Has  Had  All these   Symptoms   For quite  A  While

## 2013-05-12 NOTE — Discharge Instructions (Signed)
Body Ringworm Ringworm (tinea corporis) is a fungal infection of the skin on the body. This infection is not caused by worms, but is actually caused by a fungus. Fungus normally lives on the top of your skin and can be useful. However, in the case of ringworms, the fungus grows out of control and causes a skin infection. It can involve any area of skin on the body and can spread easily from one person to another (contagious). Ringworm is a common problem for children, but it can affect adults as well. Ringworm is also often found in athletes, especially wrestlers who share equipment and mats.  CAUSES  Ringworm of the body is caused by a fungus called dermatophyte. It can spread by:  Touchingother people who are infected.  Touchinginfected pets.  Touching or sharingobjects that have been in contact with the infected person or pet (hats, combs, towels, clothing, sports equipment). SYMPTOMS   Itchy, raised red spots and bumps on the skin.  Ring-shaped rash.  Redness near the border of the rash with a clear center.  Dry and scaly skin on or around the rash. Not every person develops a ring-shaped rash. Some develop only the red, scaly patches. DIAGNOSIS  Most often, ringworm can be diagnosed by performing a skin exam. Your caregiver may choose to take a skin scraping from the affected area. The sample will be examined under the microscope to see if the fungus is present.  TREATMENT  Body ringworm may be treated with a topical antifungal cream or ointment. Sometimes, an antifungal shampoo that can be used on your body is prescribed. You may be prescribed antifungal medicines to take by mouth if your ringworm is severe, keeps coming back, or lasts a long time.  HOME CARE INSTRUCTIONS   Only take over-the-counter or prescription medicines as directed by your caregiver.  Wash the infected area and dry it completely before applying yourcream or ointment.  When using antifungal shampoo to  treat the ringworm, leave the shampoo on the body for 3 5 minutes before rinsing.   Wear loose clothing to stop clothes from rubbing and irritating the rash.  Wash or change your bed sheets every night while you have the rash.  Have your pet treated by your veterinarian if it has the same infection. To prevent ringworm:   Practice good hygiene.  Wear sandals or shoes in public places and showers.  Do not share personal items with others.  Avoid touching red patches of skin on other people.  Avoid touching pets that have bald spots or wash your hands after doing so. SEEK MEDICAL CARE IF:   Your rash continues to spread after 7 days of treatment.  Your rash is not gone in 4 weeks.  The area around your rash becomes red, warm, tender, and swollen. Document Released: 01/07/2000 Document Revised: 10/04/2011 Document Reviewed: 07/24/2011 [facility] Patient Information 2014 [place].  Serous Otitis Media  Serous otitis media is fluid in the middle ear space. This space contains the bones for hearing and air. Air in the middle ear space helps to transmit sound.  The air gets there through the eustachian tube. This tube goes from the back of the nose (nasopharynx) to the middle ear space. It keeps the pressure in the middle ear the same as the outside world. It also helps to drain fluid from the middle ear space. CAUSES  Serous otitis media occurs when the eustachian tube gets blocked. Blockage can come from:  Ear infections.  Colds  and other upper respiratory infections.  Allergies.  Irritants such as cigarette smoke.  Sudden changes in air pressure (such as descending in an airplane).  Enlarged adenoids.  A mass in the nasopharynx. During colds and upper respiratory infections, the middle ear space can become temporarily filled with fluid. This can happen after an ear infection also. Once the infection clears, the fluid will generally drain out of the ear through the  eustachian tube. If it does not, then serous otitis media occurs. SIGNS AND SYMPTOMS   Hearing loss.  A feeling of fullness in the ear, without pain.  Young children may not show any symptoms but may show slight behavioral changes, such as agitation, ear pulling, or crying. DIAGNOSIS  Serous otitis media is diagnosed by an ear exam. Tests may be done to check on the movement of the eardrum. Hearing exams may also be done. TREATMENT  The fluid most often goes away without treatment. If allergy is the cause, allergy treatment may be helpful. Fluid that persists for several months may require minor surgery. A small tube is placed in the eardrum to:  Drain the fluid.  Restore the air in the middle ear space. In certain situations, antibiotics are used to avoid surgery. Surgery may be done to remove enlarged adenoids (if this is the cause). HOME CARE INSTRUCTIONS   Keep children away from tobacco smoke.  Be sure to keep any follow-up appointments. SEEK MEDICAL CARE IF:   Your hearing is not better in 3 months.  Your hearing is worse.  You have ear pain.  You have drainage from the ear.  You have dizziness.  You have serous otitis media only in one ear or have any bleeding from your nose (epistaxis).  You notice a lump on your neck. MAKE SURE YOU:  Understand these instructions.   Will watch your condition.   Will get help right away if you are not doing well or get worse.  Document Released: 04/01/2003 Document Revised: 09/11/2012 Document Reviewed: 08/06/2012 [facility] Patient Information 2014 [place], [place].

## 2013-05-12 NOTE — ED Provider Notes (Signed)
CSN: [id number]     Arrival date & time 05/12/13  1308 History   First MD Initiated Contact with Patient 05/12/13 0848     Chief Complaint  Patient presents with  . Rash   (Consider location/radiation/quality/duration/timing/severity/associated sxs/prior Treatment) HPI Comments: Patient presents with two separate issues.  First she reports several days of left ear fullness with muffled hearing. No fever, pain or drainage from ear canal.  Next she reports about a 2 month hx of rash in various locations on her body. 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 Describes scattered pruritic lesions.   Patient is a 38 y.o. female presenting with rash. The history is provided by the patient.  Rash   Past Medical History  Diagnosis Date  . Hypertension   . Multiple abscesses of both legs    Past Surgical History  Procedure Laterality Date  . Pilonidal cyst excision    . Cesarean section     History reviewed. No pertinent family history. History  Substance Use Topics  . Smoking status: Current Every [place] Smoker -- 1.00 packs/[place]  . Smokeless tobacco: Not on file  . Alcohol Use: No   OB History   Grav Para Term Preterm Abortions TAB SAB Ect Mult Living                 Review of Systems  Skin: Positive for rash.  All other systems reviewed and are negative.   Allergies  Review of patient's allergies indicates no known allergies.  Home Medications   Prior to Admission medications   Medication Sig Start Date End Date Taking? Authorizing Provider  ciprofloxacin (CIPRO) 500 MG tablet Take 1 tablet (500 mg total) by mouth 2 (two) times daily. 02/20/13   Reyne Dumas, MD  ferrous sulfate 300 (60 FE) MG/5ML syrup Take 5.4 mLs (325 mg total) by mouth daily. 02/21/13   Reyne Dumas, MD  fluconazole (DIFLUCAN) 150 MG tablet Take 1 tablet (150 mg total) by mouth every morning. 02/20/13   Reyne Dumas, MD    lisinopril-hydrochlorothiazide (PRINZIDE,ZESTORETIC) 20-25 MG per tablet Take 1 tablet by mouth daily. 02/20/13   Reyne Dumas, MD  metroNIDAZOLE (FLAGYL) 500 MG tablet Take 1 tablet (500 mg total) by mouth 3 (three) times daily. 02/20/13   Reyne Dumas, MD  varenicline (CHANTIX) 1 MG tablet Take 1 tablet (1 mg total) by mouth 2 (two) times daily. 10/18/12   Theodis Blaze, MD   BP 142/86  Pulse 78  Temp(Src) 98.6 F (37 C) (Oral)  Resp 16  SpO2 100% Physical Exam  Nursing note and vitals reviewed. Constitutional: She is oriented to person, place, and time. She appears well-developed and well-nourished. No distress.  HENT:  Head: Normocephalic and atraumatic.  Right Ear: Hearing, tympanic membrane, external ear and ear canal normal.  Left Ear: External ear and ear canal normal. A middle ear effusion is present. Decreased hearing is noted.  Mouth/Throat: Uvula is midline, oropharynx is clear and moist and mucous membranes are normal.  +smal amount of clear fluid visible behind normal TM  Cardiovascular: Normal rate.   Pulmonary/Chest: Effort normal.  Musculoskeletal: Normal range of motion.  Neurological: She is alert and oriented to person, place, and time.  Skin: Skin is warm and dry. Rash noted.       ED Course  Procedures (including critical care time) Labs Review Labs Reviewed - No data to display  Results for orders  placed in visit on 02/20/13  COMPLETE METABOLIC PANEL WITH GFR      Result Value Ref Range   Sodium 135  135 - 145 mEq/L   Potassium 4.6  3.5 - 5.3 mEq/L   Chloride 101  96 - 112 mEq/L   CO2 28  19 - 32 mEq/L   Glucose, Bld 92  70 - 99 mg/dL   BUN 10  6 - 23 mg/dL   Creat 0.67  0.50 - 1.10 mg/dL   Total Bilirubin 0.3  0.2 - 1.2 mg/dL   Alkaline Phosphatase 63  39 - 117 U/L   AST 15  0 - 37 U/L   ALT 13  0 - 35 U/L   Total Protein 7.1  6.0 - 8.3 g/dL   Albumin 4.4  3.5 - 5.2 g/dL   Calcium 9.5  8.4 - 10.5 mg/dL   GFR, Est African American >89     GFR,  Est Non African American >89    TSH      Result Value Ref Range   TSH 0.671  0.350 - 4.500 uIU/mL  CBC WITH DIFFERENTIAL      Result Value Ref Range   WBC 10.9 (*) 4.0 - 10.5 K/uL   RBC 5.12 (*) 3.87 - 5.11 MIL/uL   Hemoglobin 12.6  12.0 - 15.0 g/dL   HCT 38.1  36.0 - 46.0 %   MCV 74.4 (*) 78.0 - 100.0 fL   MCH 24.6 (*) 26.0 - 34.0 pg   MCHC 33.1  30.0 - 36.0 g/dL   RDW 15.6 (*) 11.5 - 15.5 %   Platelets 399  150 - 400 K/uL   Neutrophils Relative % 52  43 - 77 %   Neutro Abs 5.7  1.7 - 7.7 K/uL   Lymphocytes Relative 37  12 - 46 %   Lymphs Abs 4.1 (*) 0.7 - 4.0 K/uL   Monocytes Relative 8  3 - 12 %   Monocytes Absolute 0.9  0.1 - 1.0 K/uL   Eosinophils Relative 2  0 - 5 %   Eosinophils Absolute 0.2  0.0 - 0.7 K/uL   Basophils Relative 1  0 - 1 %   Basophils Absolute 0.1  0.0 - 0.1 K/uL   Smear Review Criteria for review not met    GC/CHLAMYDIA PROBE AMP, URINE      Result Value Ref Range   Chlamydia, Swab/Urine, PCR NEGATIVE  NEGATIVE   GC Probe Amp, Urine NEGATIVE  NEGATIVE  POCT URINALYSIS DIPSTICK      Result Value Ref Range   Color, UA yellow     Clarity, UA clear     Glucose, UA neg     Bilirubin, UA neg     Ketones, UA neg     Spec Grav, UA 1.015     Blood, UA trace     pH, UA 6.5     Protein, UA neg     Urobilinogen, UA 0.2     Nitrite, UA neg     Leukocytes, UA Trace     Imaging Review No results found.   MDM   1. Tinea corporis   2. Serous otitis media   Flonase for middle ear effusion. Lamisil topical to skin lesions with PCP follow up if no improvement.    [place], [place] 05/12/13 1044

## 2013-05-14 NOTE — ED Provider Notes (Signed)
Medical screening examination/treatment/procedure(s) were performed by a resident physician or non-physician practitioner and as the supervising physician I was immediately available for consultation/collaboration.  Lynne Leader, MD    Gregor Hams, MD 05/14/13 [phone]

## 2013-05-15 ENCOUNTER — Ambulatory Visit: Payer: PRIVATE HEALTH INSURANCE | Attending: Internal Medicine | Admitting: Internal Medicine

## 2013-05-15 ENCOUNTER — Ambulatory Visit: Payer: Self-pay

## 2013-05-15 VITALS — BP 137/88 | HR 70 | Temp 98.8°F | Resp 16 | Wt 175.0 lb

## 2013-05-15 DIAGNOSIS — Z09 Encounter for follow-up examination after completed treatment for conditions other than malignant neoplasm: Secondary | ICD-10-CM | POA: Insufficient documentation

## 2013-05-15 DIAGNOSIS — I10 Essential (primary) hypertension: Secondary | ICD-10-CM | POA: Insufficient documentation

## 2013-05-15 DIAGNOSIS — L293 Anogenital pruritus, unspecified: Secondary | ICD-10-CM | POA: Insufficient documentation

## 2013-05-15 DIAGNOSIS — F17200 Nicotine dependence, unspecified, uncomplicated: Secondary | ICD-10-CM | POA: Insufficient documentation

## 2013-05-15 DIAGNOSIS — N898 Other specified noninflammatory disorders of vagina: Secondary | ICD-10-CM | POA: Insufficient documentation

## 2013-05-15 DIAGNOSIS — Z202 Contact with and (suspected) exposure to infections with a predominantly sexual mode of transmission: Secondary | ICD-10-CM | POA: Insufficient documentation

## 2013-05-15 LAB — POCT URINALYSIS DIPSTICK
Bilirubin, UA: NEGATIVE
GLUCOSE UA: NEGATIVE
KETONES UA: NEGATIVE
LEUKOCYTES UA: NEGATIVE
Nitrite, UA: NEGATIVE
PROTEIN UA: NEGATIVE
SPEC GRAV UA: 1.025
UROBILINOGEN UA: 0.2
pH, UA: 6.5

## 2013-05-15 MED ORDER — PHENTERMINE HCL 30 MG PO CAPS: 30.0000 mg | ORAL_CAPSULE | ORAL | Status: DC

## 2013-05-15 MED ORDER — AZITHROMYCIN 250 MG PO TABS: ORAL_TABLET | ORAL | Status: DC

## 2013-05-15 MED ORDER — FLUTICASONE PROPIONATE 50 MCG/ACT NA SUSP: 2.0000 | Freq: Every day | NASAL | Status: DC

## 2013-05-15 MED ORDER — DOXYCYCLINE HYCLATE 100 MG PO TABS
100.0000 mg | ORAL_TABLET | Freq: Two times a day (BID) | ORAL | Status: DC
Start: 2013-05-15 — End: 2014-12-09

## 2013-05-15 MED ORDER — FLUCONAZOLE 150 MG PO TABS: 150.0000 mg | ORAL_TABLET | Freq: Every morning | ORAL | Status: DC

## 2013-05-15 MED ORDER — METRONIDAZOLE 500 MG PO TABS: 500.0000 mg | ORAL_TABLET | Freq: Three times a day (TID) | ORAL | Status: DC

## 2013-05-15 NOTE — Progress Notes (Signed)
Patient ID: Nancy Valenzuela, female   DOB: 22-Sep-1975, 38 y.o.   MRN: [id number]   CC: Followup  HPI: Patient is 38 year old female who presents to clinic concerned with possibility of sexually transmitted disease. She has sexual intercourse without protection. Currently she experiences vaginal itching and malodorous discharge. This started several days after the last sexual intercourse. She denies similar events in the past. She denies fevers and chills, no other systemic symptoms.  No Known Allergies Past Medical History  Diagnosis Date  . Hypertension   . Multiple abscesses of both legs    Current Outpatient Prescriptions on File Prior to Visit  Medication Sig Dispense Refill  . ciprofloxacin (CIPRO) 500 MG tablet Take 1 tablet (500 mg total) by mouth 2 (two) times daily.  14 tablet  0  . ferrous sulfate 300 (60 FE) MG/5ML syrup Take 5.4 mLs (325 mg total) by mouth daily.  30 mL  6  . lisinopril-hydrochlorothiazide (PRINZIDE,ZESTORETIC) 20-25 MG per tablet Take 1 tablet by mouth daily.  90 tablet  3  . terbinafine (LAMISIL AT) 1 % cream Apply 1 application topically 2 (two) times daily. Apply to affected areas BID x 14 days  30 g  0  . varenicline (CHANTIX) 1 MG tablet Take 1 tablet (1 mg total) by mouth 2 (two) times daily.  60 tablet  3   No current facility-administered medications on file prior to visit.   History reviewed. No pertinent family history. History   Social History  . Marital Status: Single    Spouse Name: N/A    Number of Children: N/A  . Years of Education: N/A   Occupational History  . Not on file.   Social History Main Topics  . Smoking status: Current Every [place] Smoker -- 1.00 packs/[place]  . Smokeless tobacco: Not on file  . Alcohol Use: No  . Drug Use: No  . Sexual Activity: Not on file   Other Topics Concern  . Not on file   Social History Narrative  . No narrative on file    Review of Systems  Constitutional: Negative for fever, chills, diaphoresis,  activity change, appetite change and fatigue.  HENT: Negative for ear pain, nosebleeds, congestion, facial swelling, rhinorrhea, neck pain, neck stiffness and ear discharge.   Eyes: Negative for pain, discharge, redness, itching and visual disturbance.  Respiratory: Negative for cough, choking, chest tightness, shortness of breath, wheezing and stridor.   Cardiovascular: Negative for chest pain, palpitations and leg swelling.  Gastrointestinal: Negative for abdominal distention.  Genitourinary: Negative for dysuria, urgency, frequency, hematuria, flank pain, decreased urine volume, difficulty urinating and dyspareunia.  Musculoskeletal: Negative for back pain, joint swelling, arthralgias and gait problem.  Neurological: Negative for dizziness, tremors, seizures, syncope, facial asymmetry, speech difficulty, weakness, light-headedness, numbness and headaches.  Hematological: Negative for adenopathy. Does not bruise/bleed easily.  Psychiatric/Behavioral: Negative for hallucinations, behavioral problems, confusion, dysphoric mood, decreased concentration and agitation.    Objective:   Filed Vitals:   05/15/13 1456  BP: 137/88  Pulse: 70  Temp: 98.8 F (37.1 C)  Resp: 16    Physical Exam  Constitutional: Appears well-developed and well-nourished. No distress.  CVS: RRR, S1/S2 +, no murmurs, no gallops, no carotid bruit.  Pulmonary: Effort and breath sounds normal, no stridor, rhonchi, wheezes, rales.  Abdominal: Soft. BS +,  no distension, tenderness, rebound or guarding.  Musculoskeletal: Normal range of motion. No edema and no tenderness.   genitourinary exam - deferred per patient request  Lab Results  Component Value Date   WBC 10.9* 02/20/2013   HGB 12.6 02/20/2013   HCT 38.1 02/20/2013   MCV 74.4* 02/20/2013   PLT 399 02/20/2013   Lab Results  Component Value Date   CREATININE 0.67 02/20/2013   BUN 10 02/20/2013   NA 135 02/20/2013   K 4.6 02/20/2013   CL 101 02/20/2013   CO2  28 02/20/2013    No results found for this basename: HGBA1C   Lipid Panel     Component Value Date/Time   CHOL 159 10/18/2012 1034   TRIG 43 10/18/2012 1034   HDL 39* 10/18/2012 1034   CHOLHDL 4.1 10/18/2012 1034   VLDL 9 10/18/2012 1034   LDLCALC 111* 10/18/2012 1034       Assessment and plan:   Genital itching - certainly worrisome for sexually transmitted diseases, per patient request we'll defer exam but it may be needed if symptoms persist. We'll treat empirically with doxycycline and metronidazole for 7 days. Will also cover for Chlamydia with 1 g of Zithromax to be taken today. I also suggested checking urine urinary and Chlamydia, HIV test and patient has agreed to these tests

## 2013-05-15 NOTE — Progress Notes (Signed)
Patient complains of vaginal discharge with an odor Discharge is yellow  Vaginal area is very itchy

## 2013-05-16 LAB — GC/CHLAMYDIA PROBE AMP, URINE
CHLAMYDIA, SWAB/URINE, PCR: NEGATIVE
GC PROBE AMP, URINE: NEGATIVE

## 2013-05-16 LAB — HIV ANTIBODY (ROUTINE TESTING W REFLEX): HIV 1&2 Ab, 4th Generation: NONREACTIVE

## 2013-05-22 ENCOUNTER — Ambulatory Visit: Payer: Self-pay | Admitting: Internal Medicine

## 2013-06-03 ENCOUNTER — Ambulatory Visit: Payer: PRIVATE HEALTH INSURANCE | Attending: Internal Medicine | Admitting: *Deleted

## 2013-06-03 VITALS — BP 118/82 | HR 94 | Temp 98.1°F | Resp 16 | Ht 62.0 in | Wt 177.4 lb

## 2013-06-03 DIAGNOSIS — R21 Rash and other nonspecific skin eruption: Secondary | ICD-10-CM

## 2013-06-03 MED ORDER — HYDROCORTISONE 1 % EX OINT: 1.0000 "application " | TOPICAL_OINTMENT | Freq: Two times a day (BID) | CUTANEOUS | Status: DC

## 2013-06-03 NOTE — Patient Instructions (Signed)
Rash A rash is a change in the color or texture of your skin. There are many different types of rashes. You may have other problems that accompany your rash. CAUSES   Infections.  Allergic reactions. This can include allergies to pets or foods.  Certain medicines.  Exposure to certain chemicals, soaps, or cosmetics.  Heat.  Exposure to poisonous plants.  Tumors, both cancerous and noncancerous. SYMPTOMS   Redness.  Scaly skin.  Itchy skin.  Dry or cracked skin.  Bumps.  Blisters.  Pain. DIAGNOSIS  Your caregiver may do a physical exam to determine what type of rash you have. A skin sample (biopsy) may be taken and examined under a microscope. TREATMENT  Treatment depends on the type of rash you have. Your caregiver may prescribe certain medicines. For serious conditions, you may need to see a skin doctor (dermatologist). HOME CARE INSTRUCTIONS   Avoid the substance that caused your rash.  Do not scratch your rash. This can cause infection.  You may take cool baths to help stop itching.  Only take over-the-counter or prescription medicines as directed by your caregiver.  Keep all follow-up appointments as directed by your caregiver. SEEK IMMEDIATE MEDICAL CARE IF:  You have increasing pain, swelling, or redness.  You have a fever.  You have new or severe symptoms.  You have body aches, diarrhea, or vomiting.  Your rash is not better after 3 days. MAKE SURE YOU:  Understand these instructions.  Will watch your condition.  Will get help right away if you are not doing well or get worse. Document Released: 12/30/2001 Document Revised: 04/03/2011 Document Reviewed: 10/24/2010 [facility] Patient Information 2014 [place], [place].  If symptoms persist or do not get better after two weeks. Please call the clinic so we can follow up. [phone]

## 2013-06-03 NOTE — Progress Notes (Unsigned)
Patient here due to a rash. Patient reports she was seen here and the urgent care about 2 weeks ago. Patient was prescribed some medications and did not take them. Informed patient to take her doxycycline and we will prescribe Hydrocortisone Cream 1% per Dr. Doreene Burke.

## 2013-06-28 ENCOUNTER — Emergency Department (INDEPENDENT_AMBULATORY_CARE_PROVIDER_SITE_OTHER)
Admission: EM | Admit: 2013-06-28 | Discharge: 2013-06-28 | Disposition: A | Discharge status: Home / Self Care | Payer: Self-pay | Source: Home / Self Care | Attending: Family Medicine | Admitting: Family Medicine

## 2013-06-28 ENCOUNTER — Encounter (HOSPITAL_COMMUNITY): Payer: Self-pay | Admitting: Emergency Medicine

## 2013-06-28 DIAGNOSIS — S161XXA Strain of muscle, fascia and tendon at neck level, initial encounter: Secondary | ICD-10-CM

## 2013-06-28 DIAGNOSIS — X58XXXA Exposure to other specified factors, initial encounter: Secondary | ICD-10-CM

## 2013-06-28 DIAGNOSIS — S139XXA Sprain of joints and ligaments of unspecified parts of neck, initial encounter: Secondary | ICD-10-CM

## 2013-06-28 MED ORDER — ONDANSETRON 8 MG PO TBDP: 8.0000 mg | ORAL_TABLET | Freq: Three times a day (TID) | ORAL | Status: DC | PRN

## 2013-06-28 MED ORDER — HYDROCODONE-ACETAMINOPHEN 5-325 MG PO TABS: 1.0000 | ORAL_TABLET | ORAL | Status: DC | PRN

## 2013-06-28 MED ORDER — CYCLOBENZAPRINE HCL 10 MG PO TABS: 10.0000 mg | ORAL_TABLET | Freq: Two times a day (BID) | ORAL | Status: DC | PRN

## 2013-06-28 NOTE — ED Notes (Signed)
Left ear pain, below ear soreness.Nancy Kitchenlymph node?  Denies sore throat.  Denies fever.  Onset of pain 3 days ago.  Patient mentions she grits her teeth when she sleeps, woke with neck/lymph node soreness then the ear started hurting

## 2013-06-28 NOTE — Discharge Instructions (Signed)
The likely source of your (L) neck and ear pain is strain of your sternocleidomastoid muscle. This may have been caused by your teeth gritting at night, the exercise class or a combination of both.  Start Ibuprofen 800 mg three times a [place] for 5 days. Take the other medications as directed. Use good support for your neck at night. Avoid the exercise class for a week and get your mouth guard as planned. If your pain persist or worsens please arrange follow up with your primary MD at the [facility] and [facility].  Muscle Strain A muscle strain (pulled muscle) happens when a muscle is stretched beyond normal length. It happens when a sudden, violent force stretches your muscle too far. Usually, a few of the fibers in your muscle are torn. Muscle strain is common in athletes. Recovery usually takes 1 2 weeks. Complete healing takes 5 6 weeks.  HOME CARE   Follow the PRICE method of treatment to help your injury get better. Do this the first 2 3 days after the injury:  Protect. Protect the muscle to keep it from getting injured again.  Rest. Limit your activity and rest the injured body part.  Ice. Put ice in a plastic bag. Place a towel between your skin and the bag. Then, apply the ice and leave it on from 15 20 minutes each hour. After the third Kid[place], switch to moist heat packs.  Compression. Use a splint or elastic bandage on the injured area for comfort. Do not put it on too tightly.  Elevate. Keep the injured body part above the level of your heart.  Only take medicine as told by your doctor.  Warm up before doing exercise to prevent future muscle strains. GET HELP IF:   You have more pain or puffiness (swelling) in the injured area.  You feel numbness, tingling, or notice a loss of strength in the injured area. MAKE SURE YOU:   Understand these instructions.  Will watch your condition.  Will get help right away if you are not doing well or get worse. Document  Released: 10/19/2007 Document Revised: 10/30/2012 Document Reviewed: 08/08/2012 [facility] Patient Information 2014 [place], [place].

## 2013-06-28 NOTE — ED Provider Notes (Signed)
Medical screening examination/treatment/procedure(s) were performed by a resident physician or non-physician practitioner and as the supervising physician I was immediately available for consultation/collaboration.  Lynne Leader, MD    Gregor Hams, MD 06/28/13 [phone]

## 2013-06-28 NOTE — ED Provider Notes (Signed)
CSN: [id number]     Arrival date & time 06/28/13  1551 History   First MD Initiated Contact with Patient 06/28/13 1615     Chief Complaint  Patient presents with  . Otalgia    Patient is a 38 y.o. female presenting with ear pain. The history is provided by the patient.  Otalgia Location:  Left Behind ear:  No abnormality Quality:  Aching Severity:  Moderate Onset quality:  Gradual Duration:  3 days Timing:  Constant Progression:  Unchanged Chronicity:  New Relieved by:  None tried Associated symptoms: neck pain   Associated symptoms: no congestion, no cough, no ear discharge, no fever, no headaches, no rhinorrhea and no sore throat   Pt reports 3 Fogel h/o pain to (L) ear. Reports she grits her teeth at night and initially noted (L) neck pain that has since radiated into the (L) ear. Pt also relates that she started a "bootcamp" exercise class Monday that is very challenging and Monday they did a lots of sit ups with neck extensions, etc.   Past Medical History  Diagnosis Date  . Hypertension   . Multiple abscesses of both legs    Past Surgical History  Procedure Laterality Date  . Pilonidal cyst excision    . Cesarean section     No family history on file. History  Substance Use Topics  . Smoking status: Current Every [place] Smoker -- 1.00 packs/[place]  . Smokeless tobacco: Not on file  . Alcohol Use: No   OB History   Grav Para Term Preterm Abortions TAB SAB Ect Mult Living                 Review of Systems  Constitutional: Negative for fever.  HENT: Positive for ear pain. Negative for congestion, ear discharge, rhinorrhea and sore throat.   Respiratory: Negative for cough.   Musculoskeletal: Positive for neck pain.  Neurological: Negative for headaches.  All other systems reviewed and are negative.   Allergies  Review of patient's allergies indicates no known allergies.  Home Medications   Prior to Admission medications   Medication Sig Start Date End Date  Taking? Authorizing Provider  lisinopril-hydrochlorothiazide (PRINZIDE,ZESTORETIC) 20-25 MG per tablet Take 1 tablet by mouth daily. 02/20/13  Yes Reyne Dumas, MD  azithromycin (ZITHROMAX) 250 MG tablet Take 1 gram total today, 4 tablets. 05/15/13   Theodis Blaze, MD  ciprofloxacin (CIPRO) 500 MG tablet Take 500 mg by mouth 2 (two) times daily. 06/28/2013--Patient took 3 doses of this medicine prior to being seen today--OLD script 02/20/13   Reyne Dumas, MD  doxycycline (VIBRA-TABS) 100 MG tablet Take 1 tablet (100 mg total) by mouth 2 (two) times daily. 05/15/13   Theodis Blaze, MD  ferrous sulfate 300 (60 FE) MG/5ML syrup Take 5.4 mLs (325 mg total) by mouth daily. 02/21/13   Reyne Dumas, MD  fluconazole (DIFLUCAN) 150 MG tablet Take 1 tablet (150 mg total) by mouth every morning. 05/15/13   Theodis Blaze, MD  fluticasone (FLONASE) 50 MCG/ACT nasal spray Place 2 sprays into both nostrils daily. 05/15/13   Theodis Blaze, MD  HYDROcodone-acetaminophen (NORCO/VICODIN) 5-325 MG per tablet Take 1 tablet by mouth every 4 (four) hours as needed. 06/28/13   Rhetta Mura Quashaun Lazalde, NP  hydrocortisone 1 % ointment Apply 1 application topically 2 (two) times daily. 06/03/13   Angelica Chessman, MD  metroNIDAZOLE (FLAGYL) 500 MG tablet Take 1 tablet (500 mg total) by mouth 3 (three) times daily. 05/15/13  Theodis Blaze, MD  ondansetron (ZOFRAN ODT) 8 MG disintegrating tablet Take 1 tablet (8 mg total) by mouth every 8 (eight) hours as needed for nausea or vomiting. 06/28/13   Rhetta Mura Jacere Pangborn, NP  phentermine 30 MG capsule Take 1 capsule (30 mg total) by mouth every morning. 05/15/13   Theodis Blaze, MD  terbinafine (LAMISIL AT) 1 % cream Apply 1 application topically 2 (two) times daily. Apply to affected areas BID x 14 days 05/12/13   Lahoma Rocker, PA  varenicline (CHANTIX) 1 MG tablet Take 1 tablet (1 mg total) by mouth 2 (two) times daily. 10/18/12   Theodis Blaze, MD   BP 132/92  Pulse 90  Temp(Src) 98.5 F  (36.9 C) (Oral)  Resp 16  SpO2 98%  LMP 06/23/2013 Physical Exam  Constitutional: She appears well-developed and well-nourished.  HENT:  Head: Normocephalic and atraumatic.  Right Ear: Tympanic membrane, external ear and ear canal normal.  Left Ear: Tympanic membrane, external ear and ear canal normal.  Eyes: Conjunctivae are normal.  Neck: Neck supple.      ED Course  Procedures (including critical care time) Labs Review Labs Reviewed - No data to display  Imaging Review No results found.   MDM   1. Strain of sternocleidomastoid muscle    As a result of teeth gritting at night, strain in a challenging exercise class or combination. Recommend no exercise for a week, Flexeril, Ibuprofen and Vicodin as directed and mouth guard. Pt to arrange f/u w/ PCP if not improving.     Jeryl Columbia, NP 06/28/13 [phone]

## 2013-07-22 ENCOUNTER — Other Ambulatory Visit: Payer: Self-pay | Admitting: Internal Medicine

## 2013-08-05 ENCOUNTER — Encounter: Payer: Self-pay | Admitting: Internal Medicine

## 2013-08-05 ENCOUNTER — Ambulatory Visit: Payer: Self-pay | Attending: Internal Medicine | Admitting: Internal Medicine

## 2013-08-05 VITALS — BP 139/87 | HR 99 | Temp 98.2°F | Resp 16 | Wt 168.8 lb

## 2013-08-05 DIAGNOSIS — D509 Iron deficiency anemia, unspecified: Secondary | ICD-10-CM | POA: Insufficient documentation

## 2013-08-05 DIAGNOSIS — I10 Essential (primary) hypertension: Secondary | ICD-10-CM

## 2013-08-05 DIAGNOSIS — D508 Other iron deficiency anemias: Secondary | ICD-10-CM

## 2013-08-05 DIAGNOSIS — Z202 Contact with and (suspected) exposure to infections with a predominantly sexual mode of transmission: Secondary | ICD-10-CM | POA: Insufficient documentation

## 2013-08-05 DIAGNOSIS — F17200 Nicotine dependence, unspecified, uncomplicated: Secondary | ICD-10-CM | POA: Insufficient documentation

## 2013-08-05 DIAGNOSIS — Z113 Encounter for screening for infections with a predominantly sexual mode of transmission: Secondary | ICD-10-CM

## 2013-08-05 LAB — ANEMIA PANEL
%SAT: 9 % — AB (ref 20–55)
ABS Retic: 72.7 10*3/uL (ref 19.0–186.0)
FERRITIN: 29 ng/mL (ref 10–291)
FOLATE: 8.2 ng/mL
Iron: 31 ug/dL — ABNORMAL LOW (ref 42–145)
RBC.: 5.19 MIL/uL — ABNORMAL HIGH (ref 3.87–5.11)
Retic Ct Pct: 1.4 % (ref 0.4–2.3)
TIBC: 351 ug/dL (ref 250–470)
UIBC: 320 ug/dL (ref 125–400)
VITAMIN B 12: 819 pg/mL (ref 211–911)

## 2013-08-05 LAB — COMPLETE METABOLIC PANEL WITH GFR
ALBUMIN: 4.5 g/dL (ref 3.5–5.2)
ALK PHOS: 63 U/L (ref 39–117)
ALT: 20 U/L (ref 0–35)
AST: 26 U/L (ref 0–37)
BILIRUBIN TOTAL: 0.5 mg/dL (ref 0.2–1.2)
BUN: 8 mg/dL (ref 6–23)
CO2: 30 mEq/L (ref 19–32)
CREATININE: 0.58 mg/dL (ref 0.50–1.10)
Calcium: 9.5 mg/dL (ref 8.4–10.5)
Chloride: 100 mEq/L (ref 96–112)
GFR, Est African American: >89 mL/min
GLUCOSE: 82 mg/dL (ref 70–99)
POTASSIUM: 3.5 meq/L (ref 3.5–5.3)
Sodium: 138 mEq/L (ref 135–145)
Total Protein: 6.9 g/dL (ref 6.0–8.3)

## 2013-08-05 LAB — CBC WITH DIFFERENTIAL/PLATELET
BASOS ABS: 0 10*3/uL (ref 0.0–0.1)
BASOS PCT: 0 % (ref 0–1)
EOS ABS: 0.1 10*3/uL (ref 0.0–0.7)
EOS PCT: 1 % (ref 0–5)
HEMATOCRIT: 40.1 % (ref 36.0–46.0)
HEMOGLOBIN: 13.6 g/dL (ref 12.0–15.0)
Lymphocytes Relative: 28 % (ref 12–46)
Lymphs Abs: 3.6 10*3/uL (ref 0.7–4.0)
MCH: 26.2 pg (ref 26.0–34.0)
MCHC: 33.9 g/dL (ref 30.0–36.0)
MCV: 77.3 fL — ABNORMAL LOW (ref 78.0–100.0)
MONO ABS: 1 10*3/uL (ref 0.1–1.0)
MONOS PCT: 8 % (ref 3–12)
Neutro Abs: 8 10*3/uL — ABNORMAL HIGH (ref 1.7–7.7)
Neutrophils Relative %: 63 % (ref 43–77)
Platelets: 394 10*3/uL (ref 150–400)
RBC: 5.19 MIL/uL — ABNORMAL HIGH (ref 3.87–5.11)
RDW: 15.8 % — AB (ref 11.5–15.5)
WBC: 12.7 10*3/uL — ABNORMAL HIGH (ref 4.0–10.5)

## 2013-08-05 NOTE — Progress Notes (Signed)
MRN: [id number] Name: Nancy Valenzuela  Sex: female Age: 38 y.o. DOB: [birth year]  Allergies: Review of patient's allergies indicates no known allergies.  Chief Complaint  Patient presents with  . Follow-up    HPI: Patient is 38 y.o. female who has history of her hypertension, anemia comes today for followup, she is requesting to be checked for STD, since she had her intercourse with her partner and the condom broke, she denies any urinary symptoms, she also history of anemia and took iron supplements, she denies any headache dizziness chest and shortness of breath.  Past Medical History  Diagnosis Date  . Hypertension   . Multiple abscesses of both legs     Past Surgical History  Procedure Laterality Date  . Pilonidal cyst excision    . Cesarean section        Medication List       This list is accurate as of: 08/05/13 11:22 AM.  Always use your most recent med list.               azithromycin 250 MG tablet  Commonly known as:  ZITHROMAX  Take 1 gram total today, 4 tablets.     ciprofloxacin 500 MG tablet  Commonly known as:  CIPRO  Take 500 mg by mouth 2 (two) times daily. 06/28/2013--Patient took 3 doses of this medicine prior to being seen today--OLD script     cyclobenzaprine 10 MG tablet  Commonly known as:  FLEXERIL  Take 1 tablet (10 mg total) by mouth 2 (two) times daily as needed for muscle spasms.     doxycycline 100 MG tablet  Commonly known as:  VIBRA-TABS  Take 1 tablet (100 mg total) by mouth 2 (two) times daily.     ferrous sulfate 300 (60 FE) MG/5ML syrup  Take 5.4 mLs (325 mg total) by mouth daily.     fluconazole 150 MG tablet  Commonly known as:  DIFLUCAN  Take 1 tablet (150 mg total) by mouth every morning.     fluticasone 50 MCG/ACT nasal spray  Commonly known as:  FLONASE  Place 2 sprays into both nostrils daily.     HYDROcodone-acetaminophen 5-325 MG per tablet  Commonly known as:  NORCO/VICODIN  Take 1 tablet by mouth every 4 (four)  hours as needed.     hydrocortisone 1 % ointment  Apply 1 application topically 2 (two) times daily.     lisinopril-hydrochlorothiazide 20-25 MG per tablet  Commonly known as:  PRINZIDE,ZESTORETIC  Take 1 tablet by mouth daily.     metroNIDAZOLE 500 MG tablet  Commonly known as:  FLAGYL  Take 1 tablet (500 mg total) by mouth 3 (three) times daily.     ondansetron 8 MG disintegrating tablet  Commonly known as:  ZOFRAN ODT  Take 1 tablet (8 mg total) by mouth every 8 (eight) hours as needed for nausea or vomiting.     phentermine 30 MG capsule  Take 1 capsule (30 mg total) by mouth every morning.     terbinafine 1 % cream  Commonly known as:  LAMISIL AT  Apply 1 application topically 2 (two) times daily. Apply to affected areas BID x 14 days     varenicline 1 MG tablet  Commonly known as:  CHANTIX  Take 1 tablet (1 mg total) by mouth 2 (two) times daily.        No orders of the defined types were placed in this encounter.    Immunization History  Administered  Date(s) Administered  . Influenza,inj,Quad PF,36+ Mos 10/18/2012  . Tdap 07/04/2012    History reviewed. No pertinent family history.  History  Substance Use Topics  . Smoking status: Current Every [place] Smoker -- 1.00 packs/[place]  . Smokeless tobacco: Not on file  . Alcohol Use: No    Review of Systems   As noted in HPI  Filed Vitals:   08/05/13 1037  BP: 139/87  Pulse: 99  Temp: 98.2 F (36.8 C)  Resp: 16    Physical Exam  Physical Exam  Constitutional: No distress.  Eyes: EOM are normal. Pupils are equal, round, and reactive to light.  Cardiovascular: Normal rate and regular rhythm.   Pulmonary/Chest: Breath sounds normal. No respiratory distress. She has no wheezes. She has no rales.  Musculoskeletal: She exhibits no edema.    CBC    Component Value Date/Time   WBC 10.9* 02/20/2013 1021   RBC 5.12* 02/20/2013 1021   HGB 12.6 02/20/2013 1021   HCT 38.1 02/20/2013 1021   PLT 399 02/20/2013  1021   MCV 74.4* 02/20/2013 1021   LYMPHSABS 4.1* 02/20/2013 1021   MONOABS 0.9 02/20/2013 1021   EOSABS 0.2 02/20/2013 1021   BASOSABS 0.1 02/20/2013 1021    CMP     Component Value Date/Time   NA 135 02/20/2013 1021   K 4.6 02/20/2013 1021   CL 101 02/20/2013 1021   CO2 28 02/20/2013 1021   GLUCOSE 92 02/20/2013 1021   BUN 10 02/20/2013 1021   CREATININE 0.67 02/20/2013 1021   CALCIUM 9.5 02/20/2013 1021   PROT 7.1 02/20/2013 1021   ALBUMIN 4.4 02/20/2013 1021   AST 15 02/20/2013 1021   ALT 13 02/20/2013 1021   ALKPHOS 63 02/20/2013 1021   BILITOT 0.3 02/20/2013 1021   GFRNONAA >89 02/20/2013 1021   GFRAA >89 02/20/2013 1021    Lab Results  Component Value Date/Time   CHOL 159 10/18/2012 10:34 AM    No components found with this basename: hga1c    Lab Results  Component Value Date/Time   AST 15 02/20/2013 10:21 AM    Assessment and Plan  Essential hypertension, benign - Plan: Blood pressure is well-controlled continued current medications will recheck blood chemistry COMPLETE METABOLIC PANEL WITH GFR  Screen for STD (sexually transmitted disease) - Plan: GC/chlamydia probe amp, urine, HIV antibody (with reflex)  Other iron deficiency anemias - Plan: Repeat her CBC with Differential, Anemia panel  Return in about 3 months (around 11/05/2013) for hypertension.  Lorayne Marek, MD

## 2013-08-05 NOTE — Progress Notes (Signed)
Patient here for follow up Would like blood work to check her anemia Requesting to be tested for STD's ,stated during relations Her partners condom broke

## 2013-08-06 ENCOUNTER — Telehealth: Payer: Self-pay

## 2013-08-06 LAB — HIV ANTIBODY (ROUTINE TESTING W REFLEX): HIV 1&2 Ab, 4th Generation: NONREACTIVE

## 2013-08-06 LAB — GC/CHLAMYDIA PROBE AMP, URINE
Chlamydia, Swab/Urine, PCR: NEGATIVE
GC PROBE AMP, URINE: NEGATIVE

## 2013-08-06 MED ORDER — FERROUS SULFATE 300 (60 FE) MG/5ML PO SYRP: 325.0000 mg | ORAL_SOLUTION | Freq: Every day | ORAL | Status: DC

## 2013-08-06 NOTE — Telephone Encounter (Signed)
Patient not available Left message on voice mail to return our call 

## 2013-08-06 NOTE — Telephone Encounter (Signed)
Message copied by Dorothe Pea on Wed Aug 06, 2013  3:50 PM ------      Message from: Lorayne Marek      Created: Wed Aug 06, 2013 10:03 AM       Call and let the Patient know that her STD test is negative, her hemoglobin is improved , anemia panel still shows she has iron deficiency, advise patient to continue with iron supplement. ------

## 2013-08-06 NOTE — Telephone Encounter (Signed)
Patient returned phone call and is aware of her lab results 

## 2013-08-15 ENCOUNTER — Telehealth: Payer: Self-pay | Admitting: Internal Medicine

## 2013-08-15 ENCOUNTER — Telehealth: Payer: Self-pay

## 2013-08-15 MED ORDER — FERROUS SULFATE 300 (60 FE) MG/5ML PO SYRP: 325.0000 mg | ORAL_SOLUTION | Freq: Every day | ORAL | Status: DC

## 2013-08-15 NOTE — Telephone Encounter (Signed)
Pt. Needs iron syrup prescription send to [place] - Phone [phone]. Please, f/u with Pt.

## 2013-08-15 NOTE — Telephone Encounter (Signed)
Left message on machine that her prescription for iron  Was sent to [facility]

## 2013-09-01 ENCOUNTER — Telehealth: Payer: Self-pay | Admitting: Internal Medicine

## 2013-09-01 NOTE — Telephone Encounter (Signed)
Pt states her prescription for iron was sent to her pharmacy, as req'd. Pt states the prescription could not be filled b/c per pharmacist it was "written wrong". Pls contact pt.

## 2013-09-05 ENCOUNTER — Telehealth: Payer: Self-pay | Admitting: Emergency Medicine

## 2013-09-05 MED ORDER — FERROUS SULFATE 325 (65 FE) MG PO TABS: 325.0000 mg | ORAL_TABLET | Freq: Every day | ORAL | Status: DC

## 2013-09-05 NOTE — Telephone Encounter (Signed)
Pt medication Ferrous sulfate changed due to incorrect ordering. Script changed from liquid to tablets Medication changed and e-scribed to [facility] pharmacy

## 2013-10-13 ENCOUNTER — Ambulatory Visit: Payer: Self-pay | Admitting: Internal Medicine

## 2013-10-20 ENCOUNTER — Encounter: Payer: Self-pay | Admitting: Internal Medicine

## 2013-10-20 ENCOUNTER — Ambulatory Visit: Payer: Self-pay | Attending: Internal Medicine | Admitting: Internal Medicine

## 2013-10-20 VITALS — BP 123/85 | HR 91 | Temp 98.0°F | Resp 16 | Wt 173.0 lb

## 2013-10-20 DIAGNOSIS — F17200 Nicotine dependence, unspecified, uncomplicated: Secondary | ICD-10-CM | POA: Insufficient documentation

## 2013-10-20 DIAGNOSIS — N898 Other specified noninflammatory disorders of vagina: Secondary | ICD-10-CM

## 2013-10-20 DIAGNOSIS — I10 Essential (primary) hypertension: Secondary | ICD-10-CM | POA: Insufficient documentation

## 2013-10-20 DIAGNOSIS — Z113 Encounter for screening for infections with a predominantly sexual mode of transmission: Secondary | ICD-10-CM | POA: Insufficient documentation

## 2013-10-20 DIAGNOSIS — L293 Anogenital pruritus, unspecified: Secondary | ICD-10-CM

## 2013-10-20 DIAGNOSIS — Z23 Encounter for immunization: Secondary | ICD-10-CM | POA: Insufficient documentation

## 2013-10-20 LAB — POCT URINALYSIS DIPSTICK
Bilirubin, UA: NEGATIVE
GLUCOSE UA: NEGATIVE
KETONES UA: NEGATIVE
Leukocytes, UA: NEGATIVE
NITRITE UA: NEGATIVE
PH UA: 6.5
Protein, UA: NEGATIVE
Spec Grav, UA: 1.015
Urobilinogen, UA: 0.2

## 2013-10-20 MED ORDER — LISINOPRIL-HYDROCHLOROTHIAZIDE 20-25 MG PO TABS: 1.0000 | ORAL_TABLET | Freq: Every day | ORAL | Status: DC

## 2013-10-20 NOTE — Progress Notes (Signed)
Patient her for follow up Patient states would like to be tested for STD's She has been having unprotected relations

## 2013-10-20 NOTE — Progress Notes (Signed)
MRN: [id number] Name: Nancy Valenzuela  Sex: female Age: 38 y.o. DOB: [birth year]  Allergies: Review of patient's allergies indicates no known allergies.  Chief Complaint  Patient presents with  . Follow-up    HPI: Patient is 38 y.o. female who has to of hypertension comes today requesting STD check, one week ago she had some vaginal itching but denies any discharge currently denies any itching denies any dysuria, as per patient she had unprotected sex and wants to be checked for STDs, denies any fever chills nausea vomiting, she also has been taking her blood pressure medication, her blood pressure is well controlled, she's requesting refill on medication.  Past Medical History  Diagnosis Date  . Hypertension   . Multiple abscesses of both legs     Past Surgical History  Procedure Laterality Date  . Pilonidal cyst excision    . Cesarean section        Medication List       This list is accurate as of: 10/20/13  5:17 PM.  Always use your most recent med list.               azithromycin 250 MG tablet  Commonly known as:  ZITHROMAX  Take 1 gram total today, 4 tablets.     ciprofloxacin 500 MG tablet  Commonly known as:  CIPRO  Take 500 mg by mouth 2 (two) times daily. 06/28/2013--Patient took 3 doses of this medicine prior to being seen today--OLD script     cyclobenzaprine 10 MG tablet  Commonly known as:  FLEXERIL  Take 1 tablet (10 mg total) by mouth 2 (two) times daily as needed for muscle spasms.     doxycycline 100 MG tablet  Commonly known as:  VIBRA-TABS  Take 1 tablet (100 mg total) by mouth 2 (two) times daily.     ferrous sulfate 325 (65 FE) MG tablet  Take 1 tablet (325 mg total) by mouth daily with breakfast.     fluconazole 150 MG tablet  Commonly known as:  DIFLUCAN  Take 1 tablet (150 mg total) by mouth every morning.     fluticasone 50 MCG/ACT nasal spray  Commonly known as:  FLONASE  Place 2 sprays into both nostrils daily.     HYDROcodone-acetaminophen 5-325 MG per tablet  Commonly known as:  NORCO/VICODIN  Take 1 tablet by mouth every 4 (four) hours as needed.     hydrocortisone 1 % ointment  Apply 1 application topically 2 (two) times daily.     lisinopril-hydrochlorothiazide 20-25 MG per tablet  Commonly known as:  PRINZIDE,ZESTORETIC  Take 1 tablet by mouth daily.     metroNIDAZOLE 500 MG tablet  Commonly known as:  FLAGYL  Take 1 tablet (500 mg total) by mouth 3 (three) times daily.     ondansetron 8 MG disintegrating tablet  Commonly known as:  ZOFRAN ODT  Take 1 tablet (8 mg total) by mouth every 8 (eight) hours as needed for nausea or vomiting.     phentermine 30 MG capsule  Take 1 capsule (30 mg total) by mouth every morning.     terbinafine 1 % cream  Commonly known as:  LAMISIL AT  Apply 1 application topically 2 (two) times daily. Apply to affected areas BID x 14 days     varenicline 1 MG tablet  Commonly known as:  CHANTIX  Take 1 tablet (1 mg total) by mouth 2 (two) times daily.        Meds ordered  this encounter  Medications  . lisinopril-hydrochlorothiazide (PRINZIDE,ZESTORETIC) 20-25 MG per tablet    Sig: Take 1 tablet by mouth daily.    Dispense:  90 tablet    Refill:  3    Immunization History  Administered Date(s) Administered  . Influenza,inj,Quad PF,36+ Mos 10/18/2012, 10/20/2013  . Tdap 07/04/2012    History reviewed. No pertinent family history.  History  Substance Use Topics  . Smoking status: Current Every [place] Smoker -- 1.00 packs/[place]  . Smokeless tobacco: Not on file  . Alcohol Use: No    Review of Systems   As noted in HPI  Filed Vitals:   10/20/13 1659  BP: 123/85  Pulse: 91  Temp: 98 F (36.7 C)  Resp: 16    Physical Exam  Physical Exam  Constitutional: No distress.  Eyes: EOM are normal. Pupils are equal, round, and reactive to light.  Cardiovascular: Normal rate and regular rhythm.   Pulmonary/Chest: Breath sounds normal. No  respiratory distress. She has no wheezes. She has no rales.  Musculoskeletal: She exhibits no edema.    CBC    Component Value Date/Time   WBC 12.7* 08/05/2013 1124   RBC 5.19* 08/05/2013 1124   RBC 5.19* 08/05/2013 1124   HGB 13.6 08/05/2013 1124   HCT 40.1 08/05/2013 1124   PLT 394 08/05/2013 1124   MCV 77.3* 08/05/2013 1124   LYMPHSABS 3.6 08/05/2013 1124   MONOABS 1.0 08/05/2013 1124   EOSABS 0.1 08/05/2013 1124   BASOSABS 0.0 08/05/2013 1124    CMP     Component Value Date/Time   NA 138 08/05/2013 1124   K 3.5 08/05/2013 1124   CL 100 08/05/2013 1124   CO2 30 08/05/2013 1124   GLUCOSE 82 08/05/2013 1124   BUN 8 08/05/2013 1124   CREATININE 0.58 08/05/2013 1124   CALCIUM 9.5 08/05/2013 1124   PROT 6.9 08/05/2013 1124   ALBUMIN 4.5 08/05/2013 1124   AST 26 08/05/2013 1124   ALT 20 08/05/2013 1124   ALKPHOS 63 08/05/2013 1124   BILITOT 0.5 08/05/2013 1124   GFRNONAA >89 08/05/2013 1124   GFRAA >89 08/05/2013 1124    Lab Results  Component Value Date/Time   CHOL 159 10/18/2012 10:34 AM    No components found with this basename: hga1c    Lab Results  Component Value Date/Time   AST 26 08/05/2013 11:24 AM    Assessment and Plan  Screen for STD (sexually transmitted disease) - Plan: GC/chlamydia probe amp, urine, HIV antibody (with reflex), also advise patient for safe sex and use condoms   Essential hypertension, benign - Plan: lisinopril-hydrochlorothiazide (PRINZIDE,ZESTORETIC) 20-25 MG per tablet, COMPLETE METABOLIC PANEL WITH GFR  Vaginal itching - Plan:  Results for orders placed in visit on 10/20/13  POCT URINALYSIS DIPSTICK      Result Value Ref Range   Color, UA yellow     Clarity, UA clear     Glucose, UA neg     Bilirubin, UA neg     Ketones, UA neg     Spec Grav, UA 1.015     Blood, UA small     pH, UA 6.5     Protein, UA neg     Urobilinogen, UA 0.2     Nitrite, UA neg     Leukocytes, UA Negative     Urinalysis Dipstick negative for  infection  Smoking Advised patient to quit smoking.  Health Maintenance Flu shot  given today.  Return in about 3 months (around  01/19/2014) for hypertension.  Lorayne Marek, MD

## 2013-10-21 LAB — GC/CHLAMYDIA PROBE AMP, URINE
Chlamydia, Swab/Urine, PCR: NEGATIVE
GC PROBE AMP, URINE: NEGATIVE

## 2013-11-10 ENCOUNTER — Ambulatory Visit: Payer: Self-pay

## 2013-12-02 ENCOUNTER — Ambulatory Visit: Payer: Self-pay

## 2014-08-23 IMAGING — CR DG FOOT COMPLETE 3+V*R*
3 series · 3 of 3 positions shown · non-contrast
Comparison: No priors.

CLINICAL DATA: History of trauma with pain in the fifth toe.

RIGHT FOOT COMPLETE - 3+ VIEW

[view not recorded (1 of 3)]
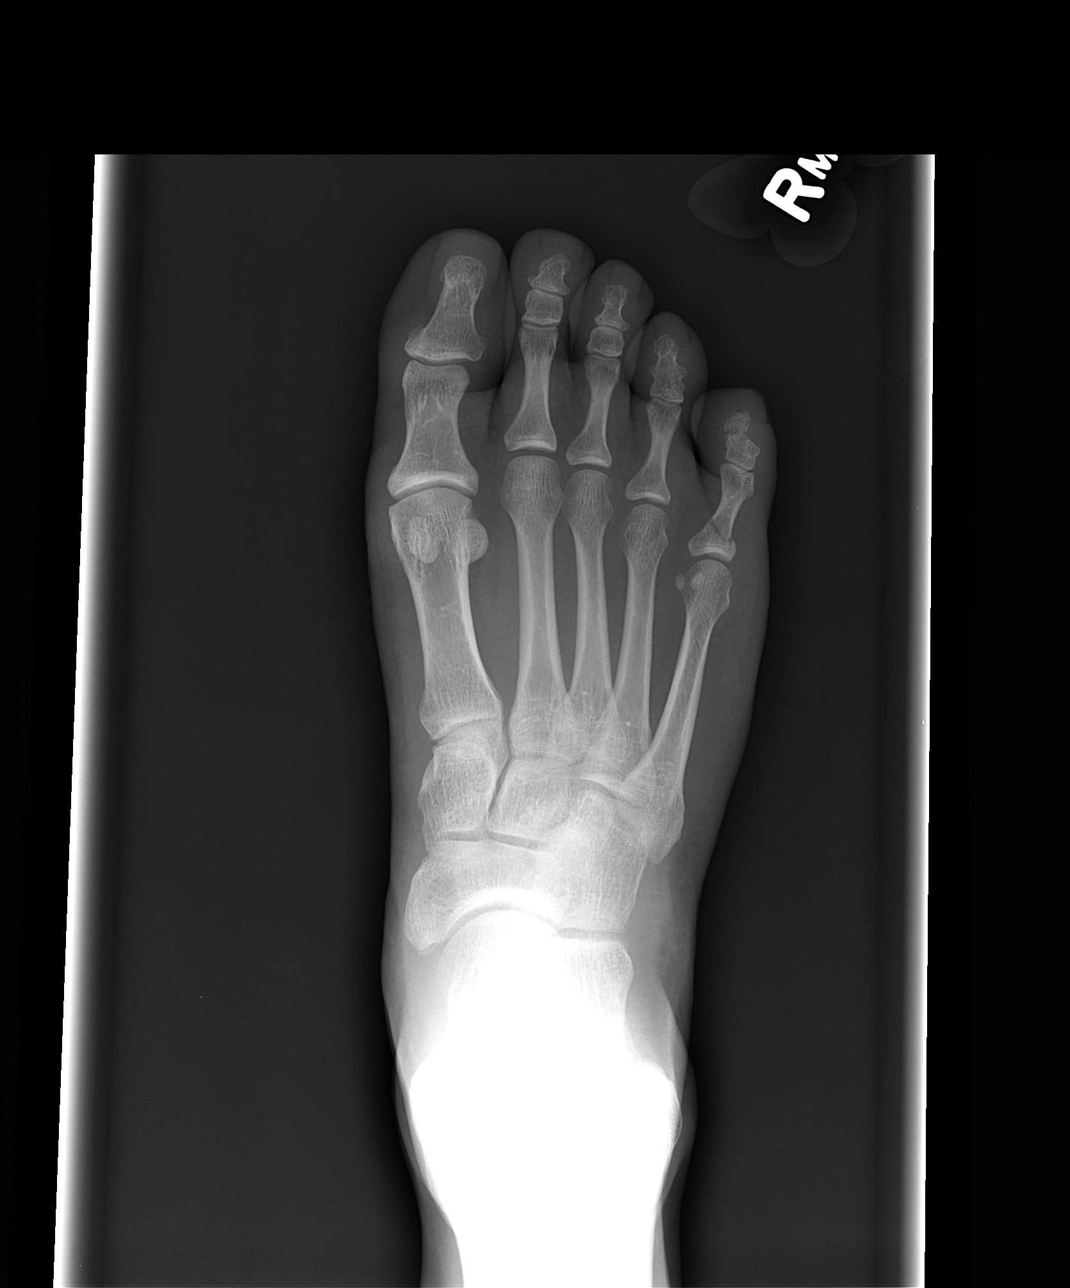

[view not recorded (2 of 3)]
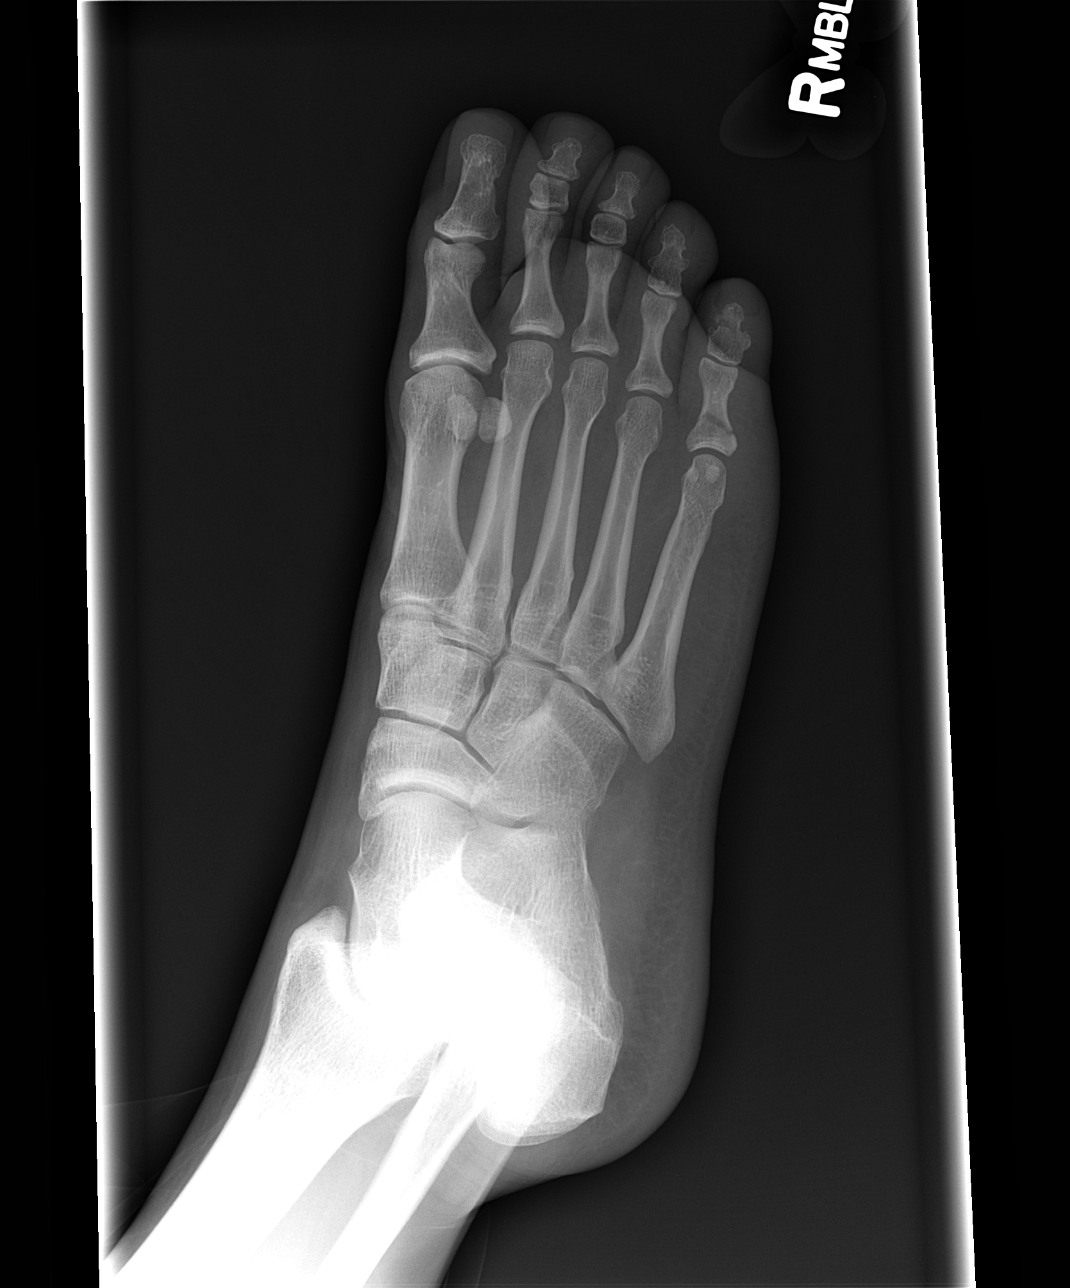

[view not recorded (3 of 3)]
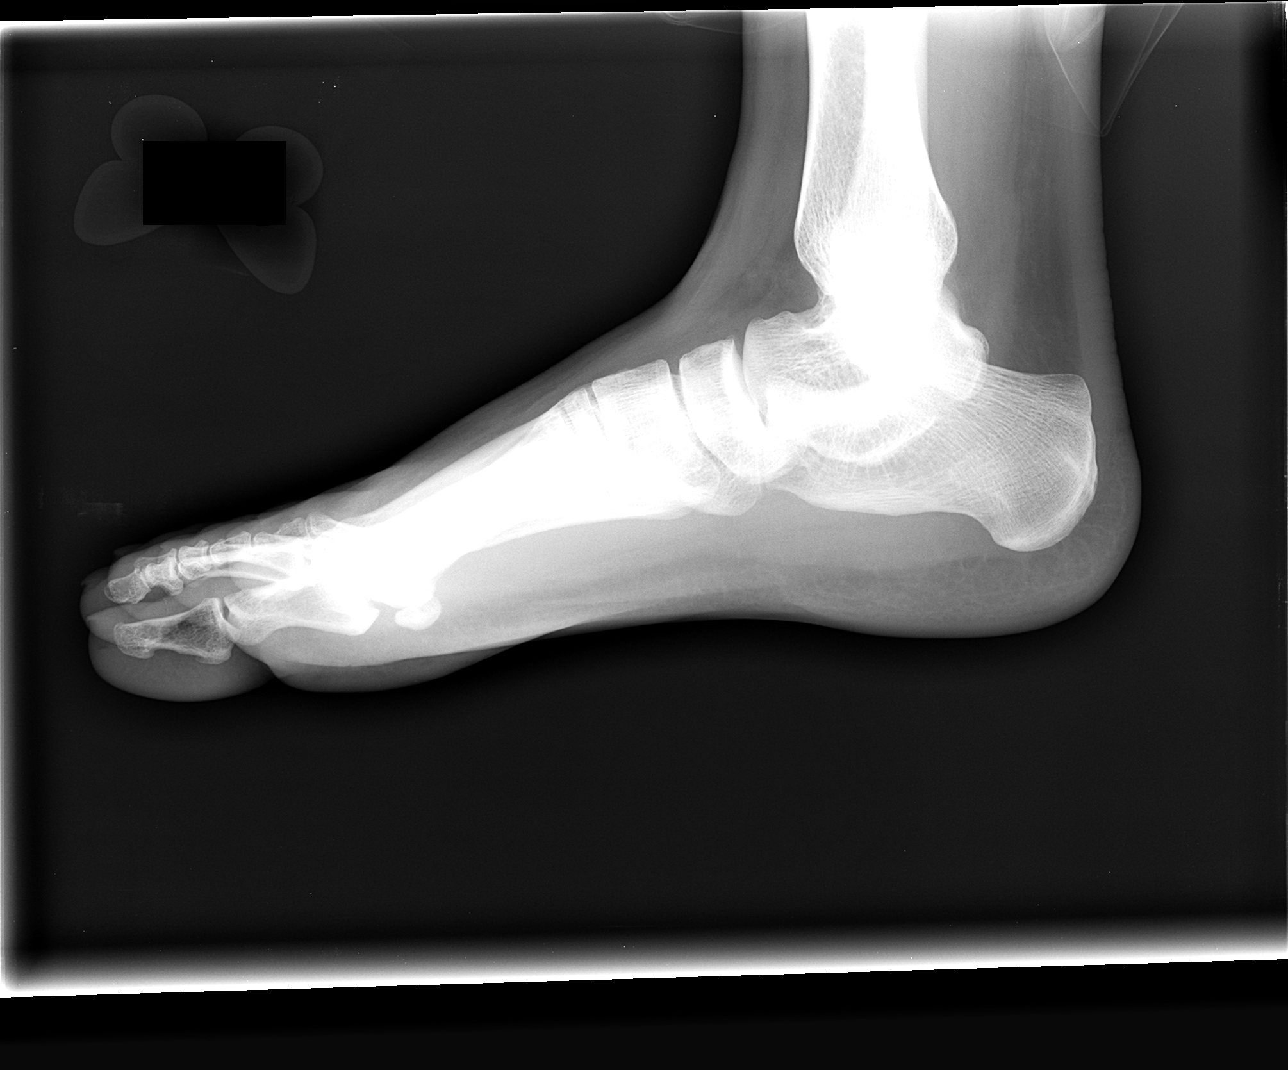

[3 of 3 positions shown; findings below may reference images not displayed]

FINDINGS: Three views of the right foot demonstrate an acute
oblique fracture through the base of the fifth proximal phalanx.
This does not appear to extend to the articular surface at the
fifth MTP joint.  No additional acute fracture is identified.  Soft
tissues overlying the fracture are swollen.
IMPRESSION: 1.  Acute nondisplaced fracture through the base of the fifth
proximal phalanx.

## 2014-11-05 ENCOUNTER — Other Ambulatory Visit: Payer: Self-pay | Admitting: Family Medicine

## 2014-11-05 ENCOUNTER — Other Ambulatory Visit (HOSPITAL_COMMUNITY)
Admission: RE | Admit: 2014-11-05 | Discharge: 2014-11-05 | Disposition: A | Discharge status: Home / Self Care | Payer: Self-pay | Source: Ambulatory Visit | Attending: Family Medicine | Admitting: Family Medicine

## 2014-11-05 DIAGNOSIS — N760 Acute vaginitis: Secondary | ICD-10-CM | POA: Insufficient documentation

## 2014-11-05 DIAGNOSIS — Z113 Encounter for screening for infections with a predominantly sexual mode of transmission: Secondary | ICD-10-CM | POA: Insufficient documentation

## 2014-11-09 LAB — URINE CYTOLOGY ANCILLARY ONLY
Chlamydia: NEGATIVE
Neisseria Gonorrhea: NEGATIVE
TRICH (WINDOWPATH): NEGATIVE

## 2014-12-09 ENCOUNTER — Ambulatory Visit (INDEPENDENT_AMBULATORY_CARE_PROVIDER_SITE_OTHER): Payer: 59 | Admitting: Podiatry

## 2014-12-09 ENCOUNTER — Encounter: Payer: Self-pay | Admitting: Podiatry

## 2014-12-09 VITALS — BP 125/95 | HR 93 | Resp 16

## 2014-12-09 DIAGNOSIS — L600 Ingrowing nail: Secondary | ICD-10-CM | POA: Diagnosis not present

## 2014-12-09 DIAGNOSIS — B351 Tinea unguium: Secondary | ICD-10-CM

## 2014-12-09 MED ORDER — TERBINAFINE HCL 250 MG PO TABS: ORAL_TABLET | ORAL | Status: DC

## 2014-12-09 NOTE — Progress Notes (Signed)
Subjective:     Patient ID: Nancy Valenzuela, female   DOB: [birth year], 39 y.o.   MRN: [id number]  HPI patient presents stating I have had a painful ingrown toenail of my left big toe from 3 years ago and I did damage the nail that time and is now yellow and thick. Points to the left hallux nail   Review of Systems  All other systems reviewed and are negative.      Objective:   Physical Exam  Constitutional: She is oriented to person, place, and time.  Cardiovascular: Intact distal pulses.   Musculoskeletal: Normal range of motion.  Neurological: She is oriented to person, place, and time.  Skin: Skin is warm.  Nursing note and vitals reviewed.  neurovascular status intact muscle strength adequate range of motion within normal limits with patient noted to have an incurvated left hallux medial border that's very tender when pressed and noted to have yellow discoloration of the distal two thirds of the nail left hallux. Other nails appear to be in good condition with no indications of fungal infiltration. Patient is well oriented 3 with good digital perfusion     Assessment:     Probable trauma to the left big toe with ingrown toenail component left hallux medial border and probable fungal component secondary to trauma    Plan:     H&P and conditions reviewed with patient. I've recommended removal of the nail corner and I explained the procedure and risk and patient wants the surgery. At this time I infiltrated the left hallux 60 mg Xylocaine Marcaine mixture remove the medial border exposed matrix and applied phenol 3 applications 30 seconds followed by alcohol lavage and sterile dressing. Gave instructions on soaks and reappoint and also we will start pulse Lamisil therapy to try to control the probable secondary fungal infection process

## 2014-12-09 NOTE — Patient Instructions (Signed)
Long Term Care Instructions-Post Nail Surgery  You have had your ingrown toenail and root treated with a chemical.  This chemical causes a burn that will drain and ooze like a blister.  This can drain for 6-8 weeks or longer.  It is important to keep this area clean, covered, and follow the soaking instructions dispensed at the time of your surgery.  This area will eventually dry and form a scab.  Once the scab forms you no longer need to soak or apply a dressing.  If at any time you experience an increase in pain, redness, swelling, or drainage, you should contact the office as soon as possible.ANTIBACTERIAL SOAP INSTRUCTIONS  THE [place] AFTER PROCEDURE  Please follow the instructions your doctor has marked.   Shower as usual. Before getting out, place a drop of antibacterial liquid soap (Dial) on a wet, clean washcloth.  Gently wipe washcloth over affected area.  Afterward, rinse the area with warm water.  Blot the area dry with a soft cloth and cover with antibiotic ointment (neosporin, polysporin, bacitracin) and band aid or gauze and tape  Place 3-4 drops of antibacterial liquid soap in a quart of warm tap water.  Submerge foot into water for 20 minutes.  If bandage was applied after your procedure, leave on to allow for easy lift off, then remove and continue with soak for the remaining time.  Next, blot area dry with a soft cloth and cover with a bandage.  Apply other medications as directed by your doctor, such as cortisporin otic solution (eardrops) or neosporin antibiotic ointment 

## 2014-12-10 ENCOUNTER — Other Ambulatory Visit: Payer: Self-pay | Admitting: Internal Medicine

## 2014-12-10 ENCOUNTER — Telehealth: Payer: Self-pay | Admitting: *Deleted

## 2014-12-10 DIAGNOSIS — E049 Nontoxic goiter, unspecified: Secondary | ICD-10-CM

## 2014-12-10 NOTE — Telephone Encounter (Signed)
Called patient at [phone] (Home #) to check to see how they were doing from their ingrown toenail procedure that was performed on Monday, December 08, 2014. Pt stated, "feeling better but toe was throbbing last night". Pt also stated, ""Pain has gotten better as the Housey went on". Pt has soaked toe with some relief.

## 2014-12-22 ENCOUNTER — Telehealth: Payer: Self-pay | Admitting: *Deleted

## 2014-12-22 NOTE — Telephone Encounter (Signed)
Pt asked if she could go back to the gym.  I left message telling pt that depending on the activity, type of shoe and her comfort level, she could go to the gym, may have more oozing or bleeding to the area, be sure to cleanse the area as directed after the activity and apply antibiotic ointment as instructed.

## 2014-12-23 ENCOUNTER — Ambulatory Visit
Admission: RE | Admit: 2014-12-23 | Discharge: 2014-12-23 | Disposition: A | Discharge status: Home / Self Care | Payer: Managed Care, Other (non HMO) | Source: Ambulatory Visit | Attending: Internal Medicine | Admitting: Internal Medicine

## 2014-12-23 DIAGNOSIS — E049 Nontoxic goiter, unspecified: Secondary | ICD-10-CM

## 2015-04-19 ENCOUNTER — Other Ambulatory Visit (HOSPITAL_COMMUNITY)
Admission: RE | Admit: 2015-04-19 | Discharge: 2015-04-19 | Disposition: A | Discharge status: Home / Self Care | Payer: Managed Care, Other (non HMO) | Source: Ambulatory Visit | Attending: Family Medicine | Admitting: Family Medicine

## 2015-04-19 ENCOUNTER — Other Ambulatory Visit: Payer: Self-pay | Admitting: Family Medicine

## 2015-04-19 DIAGNOSIS — N760 Acute vaginitis: Secondary | ICD-10-CM | POA: Diagnosis present

## 2015-04-19 DIAGNOSIS — Z113 Encounter for screening for infections with a predominantly sexual mode of transmission: Secondary | ICD-10-CM | POA: Insufficient documentation

## 2015-04-22 LAB — URINE CYTOLOGY ANCILLARY ONLY
CANDIDA VAGINITIS: NEGATIVE
Chlamydia: NEGATIVE
Neisseria Gonorrhea: NEGATIVE
TRICH (WINDOWPATH): NEGATIVE

## 2015-04-27 ENCOUNTER — Encounter (HOSPITAL_COMMUNITY): Payer: Self-pay | Admitting: *Deleted

## 2015-04-27 ENCOUNTER — Ambulatory Visit (INDEPENDENT_AMBULATORY_CARE_PROVIDER_SITE_OTHER)
Admission: EM | Admit: 2015-04-27 | Discharge: 2015-04-27 | Disposition: A | Discharge status: Home / Self Care | Payer: Managed Care, Other (non HMO) | Source: Home / Self Care

## 2015-04-27 DIAGNOSIS — N949 Unspecified condition associated with female genital organs and menstrual cycle: Secondary | ICD-10-CM

## 2015-04-27 NOTE — ED Provider Notes (Signed)
CSN: [id number]     Arrival date & time 04/27/15  1301 History   None    No chief complaint on file.  (Consider location/radiation/quality/duration/timing/severity/associated sxs/prior Treatment) HPI Comments: Patient worried she may have foreign body in her vagina and she is on her menstrual cycle.  Patient Is very vague and states she thought she felt something foreign or strange in her vaginal area.  Denies inserting anything or having retained tampon.  Patient is a 40 y.o. female presenting with vaginal itching.  Vaginal Itching This is a new problem. The current episode started 6 to 12 hours ago. The problem occurs constantly. The problem has not changed since onset.Nothing aggravates the symptoms. Nothing relieves the symptoms. She has tried nothing for the symptoms.    Past Medical History  Diagnosis Date  . Hypertension   . Multiple abscesses of both legs    Past Surgical History  Procedure Laterality Date  . Pilonidal cyst excision    . Cesarean section     No family history on file. Social History  Substance Use Topics  . Smoking status: Current Every [place] Smoker -- 1.00 packs/[place]  . Smokeless tobacco: Not on file  . Alcohol Use: No   OB History    No data available     Review of Systems  Constitutional: Negative.   HENT: Negative.   Eyes: Negative.   Respiratory: Negative.   Cardiovascular: Negative.   Gastrointestinal: Negative.   Endocrine: Negative.   Genitourinary: Positive for vaginal pain.  Musculoskeletal: Negative.   Skin: Negative.   Allergic/Immunologic: Negative.   Neurological: Negative.   Hematological: Negative.   Psychiatric/Behavioral: Negative.     Allergies  Review of patient's allergies indicates no known allergies.  Home Medications   Prior to Admission medications   Medication Sig Start Date End Date Taking? Authorizing Provider  lisinopril-hydrochlorothiazide (PRINZIDE,ZESTORETIC) 20-25 MG per tablet Take 1 tablet by mouth  daily. 10/20/13   Lorayne Marek, MD  terbinafine (LAMISIL) 250 MG tablet Take one tablet once daily x 7 days, then repeat every month x 4 months 12/09/14   Wallene Huh, DPM   Meds Ordered and Administered this Visit  Medications - No data to display  BP 138/92 mmHg  Pulse 94  Temp(Src) 97.9 F (36.6 C) (Oral)  Resp 16  SpO2 99% No data found.   Physical Exam  Constitutional: She appears well-developed and well-nourished.  HENT:  Head: Normocephalic and atraumatic.  Cardiovascular: Normal rate and regular rhythm.   Pulmonary/Chest: Effort normal and breath sounds normal.  Genitourinary: Vagina normal. No vaginal discharge found.  Vagina is normal w/o foreign body.  She is on her menses. No CMT.  Os is parous and normal. Menses noted from OS and vaginal vault with blood.    ED Course  Procedures (including critical care time)  Labs Review Labs Reviewed - No data to display  Imaging Review No results found.   Visual Acuity Review  Right Eye Distance:   Left Eye Distance:   Bilateral Distance:    Right Eye Near:   Left Eye Near:    Bilateral Near:         MDM  Normal Female Exam  Menses  Patient is reassured and advised that no FB is seen and exam is normal.    Lysbeth Penner, FNP 04/27/15 1357

## 2015-04-27 NOTE — Discharge Instructions (Signed)
Follow up with PCP

## 2015-04-27 NOTE — ED Notes (Signed)
Pt thinks she may have left a tampon in her vagina.  She denies drainage

## 2015-09-23 ENCOUNTER — Other Ambulatory Visit: Payer: Self-pay | Admitting: Family Medicine

## 2015-09-23 ENCOUNTER — Other Ambulatory Visit (HOSPITAL_COMMUNITY)
Admission: RE | Admit: 2015-09-23 | Discharge: 2015-09-23 | Disposition: A | Discharge status: Home / Self Care | Payer: Managed Care, Other (non HMO) | Source: Ambulatory Visit | Attending: Family Medicine | Admitting: Family Medicine

## 2015-09-23 DIAGNOSIS — N760 Acute vaginitis: Secondary | ICD-10-CM | POA: Insufficient documentation

## 2015-09-23 DIAGNOSIS — Z01419 Encounter for gynecological examination (general) (routine) without abnormal findings: Secondary | ICD-10-CM | POA: Insufficient documentation

## 2015-09-23 DIAGNOSIS — Z113 Encounter for screening for infections with a predominantly sexual mode of transmission: Secondary | ICD-10-CM | POA: Insufficient documentation

## 2015-09-23 DIAGNOSIS — Z1151 Encounter for screening for human papillomavirus (HPV): Secondary | ICD-10-CM | POA: Insufficient documentation

## 2015-09-24 LAB — CYTOLOGY - PAP

## 2015-09-28 LAB — URINE CYTOLOGY ANCILLARY ONLY
Bacterial vaginitis: POSITIVE — AB
CANDIDA VAGINITIS: NEGATIVE
Chlamydia: NEGATIVE
NEISSERIA GONORRHEA: NEGATIVE
TRICH (WINDOWPATH): NEGATIVE

## 2016-04-19 ENCOUNTER — Other Ambulatory Visit (HOSPITAL_COMMUNITY)
Admission: RE | Admit: 2016-04-19 | Discharge: 2016-04-19 | Disposition: A | Discharge status: Home / Self Care | Payer: Managed Care, Other (non HMO) | Source: Ambulatory Visit | Attending: Family Medicine | Admitting: Family Medicine

## 2016-04-19 ENCOUNTER — Other Ambulatory Visit: Payer: Self-pay | Admitting: Family Medicine

## 2016-04-19 DIAGNOSIS — Z113 Encounter for screening for infections with a predominantly sexual mode of transmission: Secondary | ICD-10-CM | POA: Insufficient documentation

## 2016-04-24 LAB — URINE CYTOLOGY ANCILLARY ONLY
BACTERIAL VAGINITIS: NEGATIVE
CANDIDA VAGINITIS: NEGATIVE
Chlamydia: NEGATIVE
Neisseria Gonorrhea: NEGATIVE
Trichomonas: NEGATIVE

## 2017-06-27 ENCOUNTER — Encounter: Payer: Self-pay | Admitting: Podiatry

## 2017-06-27 ENCOUNTER — Ambulatory Visit: Payer: 59 | Admitting: Podiatry

## 2017-06-27 DIAGNOSIS — L84 Corns and callosities: Secondary | ICD-10-CM

## 2017-06-27 DIAGNOSIS — M2042 Other hammer toe(s) (acquired), left foot: Secondary | ICD-10-CM

## 2017-06-27 DIAGNOSIS — M2041 Other hammer toe(s) (acquired), right foot: Secondary | ICD-10-CM | POA: Diagnosis not present

## 2017-06-27 DIAGNOSIS — M2010 Hallux valgus (acquired), unspecified foot: Secondary | ICD-10-CM | POA: Diagnosis not present

## 2017-06-27 NOTE — Progress Notes (Signed)
This patient e office with chief complaint of a corn on her fifth toe right foot and calluses under the ball of both feet.  She says she applied acid to the corn on the fifth toe and the skin came off leaving a darkened  Area n her fifth toe.  She also says that she has callus noted on the bottoms of both of her forefeet.  She says these calluses but she stilpicks at them at home.  . She presents the office today for an evaluation and treatment of these corns and calluses both feet.  General Appearance  Alert, conversant and in no acute stress.  Vascular  Dorsalis pedis and posterior tibial  pulses are palpable  bilaterally.  Capillary return is within normal limits  bilaterally. Temperature is within normal limits  bilaterally.  Neurologic  Senn-Weinstein monofilament wire test within normal limits  bilaterally. Muscle power within normal limits bilaterally.  Nails , normal nails noticed with no evidence  of bacterial or fungal infection.  Orthopedic  No limitations of motion of motion feet .  No crepitus or effusions noted.  Mild HAV  B/L.  Adducto varus fifth hammer toes  B/L.  Skin  normotropic skin with no porokeratosis noted bilaterally.  No signs of infections or ulcers noted.  Dark area at site of acid usage.  Hammer toes  B/L  HAV  B/L.  IE  . Discussed her pathology with this patient. Explained that the corns are caused by the rotation of the fifth digit both feet.  Explained to her that the forefoot calluses develop because of the presence of the bunions.  Discussed conservative versus surgical correction of these problems.  She is to call the office and return to the office in the future as needed.   Gardiner Barefoot DPM

## 2017-07-24 ENCOUNTER — Other Ambulatory Visit: Payer: Self-pay | Admitting: Family Medicine

## 2017-07-24 ENCOUNTER — Other Ambulatory Visit (HOSPITAL_COMMUNITY)
Admission: RE | Admit: 2017-07-24 | Discharge: 2017-07-24 | Disposition: A | Discharge status: Home / Self Care | Payer: 59 | Source: Ambulatory Visit | Attending: Family Medicine | Admitting: Family Medicine

## 2017-07-24 DIAGNOSIS — Z01419 Encounter for gynecological examination (general) (routine) without abnormal findings: Secondary | ICD-10-CM | POA: Insufficient documentation

## 2017-07-24 DIAGNOSIS — D259 Leiomyoma of uterus, unspecified: Secondary | ICD-10-CM

## 2017-07-27 LAB — CYTOLOGY - PAP: DIAGNOSIS: NEGATIVE

## 2017-08-01 ENCOUNTER — Ambulatory Visit
Admission: RE | Admit: 2017-08-01 | Discharge: 2017-08-01 | Disposition: A | Discharge status: Home / Self Care | Payer: Managed Care, Other (non HMO) | Source: Ambulatory Visit | Attending: Family Medicine | Admitting: Family Medicine

## 2017-08-01 DIAGNOSIS — D259 Leiomyoma of uterus, unspecified: Secondary | ICD-10-CM

## 2017-09-18 ENCOUNTER — Other Ambulatory Visit: Payer: Self-pay | Admitting: Obstetrics and Gynecology

## 2017-09-18 DIAGNOSIS — E049 Nontoxic goiter, unspecified: Secondary | ICD-10-CM

## 2017-09-21 ENCOUNTER — Ambulatory Visit
Admission: RE | Admit: 2017-09-21 | Discharge: 2017-09-21 | Disposition: A | Discharge status: Home / Self Care | Payer: Managed Care, Other (non HMO) | Source: Ambulatory Visit | Attending: Obstetrics and Gynecology | Admitting: Obstetrics and Gynecology

## 2017-09-21 DIAGNOSIS — E049 Nontoxic goiter, unspecified: Secondary | ICD-10-CM

## 2017-12-04 ENCOUNTER — Other Ambulatory Visit (HOSPITAL_COMMUNITY)
Admission: RE | Admit: 2017-12-04 | Discharge: 2017-12-04 | Disposition: A | Discharge status: Home / Self Care | Payer: 59 | Source: Ambulatory Visit | Attending: Family Medicine | Admitting: Family Medicine

## 2017-12-04 ENCOUNTER — Other Ambulatory Visit: Payer: Self-pay | Admitting: Family Medicine

## 2017-12-04 DIAGNOSIS — N898 Other specified noninflammatory disorders of vagina: Secondary | ICD-10-CM | POA: Insufficient documentation

## 2017-12-07 LAB — URINE CYTOLOGY ANCILLARY ONLY
Bacterial vaginitis: POSITIVE — AB
Candida vaginitis: NEGATIVE
Chlamydia: NEGATIVE
Neisseria Gonorrhea: NEGATIVE
TRICH (WINDOWPATH): NEGATIVE

## 2017-12-25 ENCOUNTER — Encounter: Payer: Self-pay | Admitting: Internal Medicine

## 2018-01-18 ENCOUNTER — Ambulatory Visit: Payer: Managed Care, Other (non HMO) | Admitting: Internal Medicine

## 2018-02-04 ENCOUNTER — Ambulatory Visit: Payer: Managed Care, Other (non HMO) | Admitting: Internal Medicine

## 2018-02-15 ENCOUNTER — Ambulatory Visit: Payer: Managed Care, Other (non HMO) | Admitting: Internal Medicine

## 2018-03-06 ENCOUNTER — Encounter: Payer: Self-pay | Admitting: Internal Medicine

## 2018-03-06 ENCOUNTER — Ambulatory Visit (INDEPENDENT_AMBULATORY_CARE_PROVIDER_SITE_OTHER): Payer: 59 | Admitting: Internal Medicine

## 2018-03-06 VITALS — BP 118/80 | HR 78 | Ht 63.0 in | Wt 166.0 lb

## 2018-03-06 DIAGNOSIS — E042 Nontoxic multinodular goiter: Secondary | ICD-10-CM | POA: Diagnosis not present

## 2018-03-06 DIAGNOSIS — R590 Localized enlarged lymph nodes: Secondary | ICD-10-CM | POA: Diagnosis not present

## 2018-03-06 NOTE — Patient Instructions (Signed)
-   Your thyroid is on the large size but it works normally and it does not have any large nodules that require further evaluations.  - Your thyroid nodules are less then 1 cm in size and have been that way since 2013 . A 5 year stability is sufficient to prove this is benign and yours have been followed for 6 years which prove they are benign.   - I would recommend against further thyroid imaging.  - I will leave further lymph node evaluation to your PCP

## 2018-03-06 NOTE — Progress Notes (Signed)
Name: Nancy Valenzuela  MRN/ DOB: [id number], 1975/09/06    Age/ Sex: 43 y.o., female    PCP: Lucianne Lei, MD  Reason for Endocrinology Evaluation: MNG     Date of Initial Endocrinology Evaluation: 03/06/2018     HPI: [name] is a 43 y.o. female with a past medical history of MNG. The patient presented for initial endocrinology clinic visit on 03/06/2018 for consultative assistance with her MNG.   She has been diagnosed with non-toxic MNG  since 2013. She had a dominant right complex near isthmus nodule (1.5x 1.1x 1.3 cm)  , she is S/P FNA on 03/22/2011 consistent with benign follicular nodule . Repeat ultrasound on 12/23/2014 showed significant involution of the previously sampled right nodule measuring only 0.5 cm in diameter, another sub-centimeter right inferior lobe nodule 0.6 mc was also noted , the left lobe showed multiple sub-centimeter nodules.    She has been having intermittent swelling around her neck area.  She denies any dysphagia or  shortness of breath.  She is to be on levothyroxine at some point but this has been discontinued for a while. She denies any hypo-or hyper thyroid symptoms.  No family history of thyroid disease.  HISTORY:  Past Medical History:  Past Medical History:  Diagnosis Date  . Hypertension   . Multiple abscesses of both legs    Past Surgical History:  Past Surgical History:  Procedure Laterality Date  . CESAREAN SECTION    . PILONIDAL CYST EXCISION        Social History:  reports that she has been smoking. She has been smoking about 1.00 pack per [place]. She has never used smokeless tobacco. She reports current alcohol use. She reports that she does not use drugs.  Family History: family history includes Hypertension in her father and mother; Parkinson's disease in her father; Parkinsonism in her father.   HOME MEDICATIONS: Allergies as of 03/06/2018   No Known Allergies     Medication List       Accurate as of March 06, 2018   2:55 PM. Always use your most recent med list.        amLODipine 5 MG tablet Commonly known as:  NORVASC Take 5 mg by mouth daily.   IRON 27 PO Take by mouth.   phentermine 37.5 MG capsule Take 37.5 mg by mouth every morning.   vitamin B-12 500 MCG tablet Commonly known as:  CYANOCOBALAMIN Take 500 mcg by mouth daily.         REVIEW OF SYSTEMS: A comprehensive ROS was conducted with the patient and is negative except as per HPI and below:  Review of Systems  Constitutional: Negative for fever.  HENT: Negative for congestion and sore throat.   Eyes: Negative for blurred vision and pain.  Respiratory: Negative for cough and shortness of breath.   Cardiovascular: Negative for chest pain and palpitations.  Gastrointestinal: Negative for constipation and nausea.  Genitourinary: Negative for frequency.  Musculoskeletal: Negative for neck pain.  Neurological: Negative for tingling.  Endo/Heme/Allergies: Negative for polydipsia.       OBJECTIVE:  VS: BP 118/80 (BP Location: Left Arm, Patient Position: Sitting, Cuff Size: Normal)   Pulse 78   Ht 5\' 3"  (1.6 m)   Wt 166 lb (75.3 kg)   SpO2 98%   BMI 29.41 kg/m    Wt Readings from Last 3 Encounters:  03/06/18 166 lb (75.3 kg)  10/20/13 173 lb (78.5 kg)  08/05/13 168 lb 12.8  oz (76.6 kg)     EXAM: General: Pt appears well and is in NAD  Hydration: Well-hydrated with moist mucous membranes and good skin turgor  Eyes: External eye exam normal without stare, lid lag or exophthalmos.  EOM intact.  PERRL.  Ears, Nose, Throat: Hearing: Grossly intact bilaterally Dental: Good dentition  Throat: Clear without mass, erythema or exudate  Neck: General: Supple without adenopathy. Thyroid: Thyroid size ~ 80 grams.  No nodules appreciated. No thyroid bruit.  Lungs: Clear with good BS bilat with no rales, rhonchi, or wheezes  Heart: Auscultation: RRR.  Abdomen: Normoactive bowel sounds, soft, nontender, without masses or  organomegaly palpable  Extremities:  BL LE: No pretibial edema normal ROM and strength.  Skin: Hair: Texture and amount normal with gender appropriate distribution Skin Inspection: No rashes. Skin Palpation: Skin temperature, texture, and thickness normal to palpation  Neuro: Cranial nerves: II - XII grossly intact  Motor: Normal strength throughout DTRs: 2+ and symmetric in UE without delay in relaxation phase  Mental Status: Judgment, insight: Intact Orientation: Oriented to time, place, and person Mood and affect: No depression, anxiety, or agitation     DATA REVIEWED:  12/04/2017 TSH 0.980       Thyroid Ultrasound 09/21/17  No discrete thyroid nodules of consequence in the thyroid gland. There are a few small subcentimeter nodules bilaterally without suspicious features which do not warrant further follow-up or evaluation.  Evaluation of the bilateral cervical chains demonstrates prominence of the cervical lymph nodes bilaterally. On the right, an index lymph node measures 1.3 cm in short axis. On the right, an index lymph node measures 1.5 cm in short axis. The cortices are hypoechoic and thickened. There is preservation of the fatty hilum. Of note, the degree of prominence of the nodal structures has progressed compared to November of 2016.  IMPRESSION: 1. Increasingly prominent bilateral cervical chain lymph nodes which are now slightly enlarged by imaging criteria. While this likely represents reactive lymphadenopathy, an underlying lymphoproliferative process is difficult to exclude entirely. Recommend continued clinical correlation. If there is evidence of palpable lymphadenopathy, or enlargement over time, further imaging and/or tissue sampling may become warranted. 2. No thyroid nodules requiring biopsy or follow-up identified.  Old records , labs and images have been reviewed.   ASSESSMENT/PLAN/RECOMMENDATIONS:   1. Hx of Non-Toxic Multinodular  Goiter:   -Patient is clinically and biochemically chemically euthyroid -She denies any local neck symptoms other than intermittent enlargement, but this is most likely related to her enlarged lymph node. -I have explained to her that she does have a large thyroid gland, she does have multiple sub-centimeter thyroid nodules that has been stable for the past 6 years.  Recent guidelines recommends against serial monitoring of multinodular goiter if stability has been established for 5 years. -Since the patient has stable multinodular goiter for over 6 years I would recommend against serial monitoring of her thyroid unless there are clinical indications.  2.  Cervical lymphadenopathy: -This is most likely reactive, I will defer further work-up and evaluation to her PCP.    Follow-up PRN    Signed electronically by: Mack Guise, MD  [facility] Endocrinology  [facility] [address] [place], [place] [address] Phone: [phone] FAX: [phone]   CC: Lucianne Lei, [address] [place] [address] Phone: [phone] Fax: [phone]   Return to Endocrinology clinic as below: No future appointments.

## 2018-04-29 ENCOUNTER — Other Ambulatory Visit: Payer: Self-pay | Admitting: Family Medicine

## 2018-04-29 ENCOUNTER — Other Ambulatory Visit (HOSPITAL_COMMUNITY)
Admission: RE | Admit: 2018-04-29 | Discharge: 2018-04-29 | Disposition: A | Discharge status: Home / Self Care | Payer: 59 | Source: Ambulatory Visit | Attending: Family Medicine | Admitting: Family Medicine

## 2018-04-29 DIAGNOSIS — N898 Other specified noninflammatory disorders of vagina: Secondary | ICD-10-CM | POA: Insufficient documentation

## 2018-05-02 LAB — URINE CYTOLOGY ANCILLARY ONLY
Bacterial vaginitis: POSITIVE — AB
Candida vaginitis: NEGATIVE
Chlamydia: NEGATIVE
Neisseria Gonorrhea: NEGATIVE
Trichomonas: NEGATIVE

## 2019-07-31 ENCOUNTER — Other Ambulatory Visit: Payer: Self-pay | Admitting: Obstetrics and Gynecology

## 2020-01-26 ENCOUNTER — Other Ambulatory Visit: Payer: Self-pay | Admitting: Obstetrics and Gynecology

## 2020-03-26 ENCOUNTER — Encounter (HOSPITAL_COMMUNITY): Admission: RE | Admit: 2020-03-26 | Payer: 59 | Source: Ambulatory Visit

## 2020-04-20 NOTE — H&P (Signed)
Nancy Valenzuela is a 45 y.o.  female, P: 2-0-0-2 presents for myomectomy because of symptomatic uterine fibroids and menorrhagia.  For the past 13 years the patient has had worsening menstrual periods characterized by heavy flow and pain.  Her flow lasts 10 days with the need to change her pad every 1-2 hour most days.  Additionally she has cramping rated 10/10 that is made tolerable with Ibuprofen 400 mg.  She denies any inter-menstrual bleeding, dyspareunia, changes in bowel function or vaginitis symptoms but admits to urinary frequency and pelvic pressure.  A pelvic ultrasound August 07, 2019 revealed an anteverted uterus: (11.2 fundus to external os-384.4 cc): 9.49 x 7.51 x 10.30 cm, endometrium: 5.69 mm; # 4 fibroids: fundal intramural-5.32 cm; left intramural (displaces endometrium) -5.00 cm; LUS sub-serosal-2.97 cm and anterior intramural-2.03 cm; right ovary-2.87 cm and left ovary-2.45 cm.  The patient has been given the medical and surgical management options for her fibroids and symptoms however,  she would like to proceed with removal of her fibroids and preservation of her uterus.   Past Medical History  OB History: G: 2;  P: 2-0-0-2; C-section: 2000 and 2004  GYN History: menarche: 45 YO;   LMP: 04/16/2020;    Contraception: none ;  Denies history of abnormal PAP smear.   Last PAP smear- 2019-normal  Medical History: Hypertension, Anxiety, Depression and Sickle Cell Trait  Surgical History: 2002 Pilonidal Cyst Excision Denies problems with anesthesia or history of blood transfusions  Family History: Hypertension, Breast Cancer, Sickle Cell Trait, Aortic Aneurysm and Parkinson's Disease  Social History: Single and Employed in Therapist, art and CNA    Former Smoker, occasional alcohol   Medications: Amlodipine 10 mg daily Citalopram 20 mg daily Nebivolol 10 mg daily Fusion Plus daily  No Known Allergies   Denies sensitivity to peanuts, shellfish, soy, latex or adhesives.   ROS:  Admits to rare leaking of urine with cough or sneeze but denies corrective lenses,  headache, vision changes, nasal congestion, dysphagia, tinnitus, dizziness, hoarseness, cough,  chest pain, shortness of breath, nausea, vomiting, diarrhea,constipation,  urinary frequency, urgency  dysuria, hematuria, vaginitis symptoms, pelvic pain, swelling of joints,easy bruising,  myalgias, arthralgias, skin rashes, unexplained weight loss and except as is mentioned in the history of present illness, patient's review of systems is otherwise negative.     Physical Exam  Bp: 134/78;  P: 73 bpm; R: 18;  Temperature: 98.3 degrees F orally; Weight: 174 lbs.;  Height: 5'3";  BMI: 30.8  Neck: supple without masses or thyromegaly Lungs: clear to auscultation Heart: regular rate and rhythm Abdomen: soft, non-tender and no organomegaly Pelvic:EGBUS- wnl; vagina-normal rugae; uterus-12 weeksl size and irregular, cervix without lesions or motion tenderness; adnexae-no tenderness or masses Extremities:  no clubbing, cyanosis or edema   Assesment: Symptomatic Uterine Fibroids                       Menorrhagia   Disposition: The patient was given the indication for her procedures along with the risks and benefits.  A Robot Assisted Myomectomy benefits  include lesser postoperative pain, less blood loss during surgery, reduced risk of injury to other organs due to better visualization with a 3-D HD 10 times magnifying camera, shorter hospital stay between 0-1 night and rapid recovery with return to daily routine in 2-3 weeks. Although  the robot-assisted myomectomy has a longer operative time than traditional laparotomy, in a patient with good medical history, the benefits usually outweigh the risks.  Risks include but are not limited to bleeding, infection, injury to other organs, need for laparotomy, if the endometrial cavity is breached then a C-section delivery would be warranted and transient post-operative facial  edema.    Also discussed:  1. Lack of tactile feedback with the robotic approach leaving non-visible fibroids impossible to resect with potential future growth.  2. Need for cesarean section with 2 previous c/s AND myomectomy  3. Risk of developing NEW fibroids estimated at 25 %    The patient verbalized understanding of these risks and has consented to proceed with a Robot Assisted Myomectomy with Manual Morcellation at [facility] on April 29, 2020.   CSN# [id number]   [place] J. Florene Glen, PA-C  for Dr. Dede Query. Leya Paige

## 2020-04-22 ENCOUNTER — Other Ambulatory Visit: Payer: Self-pay

## 2020-04-22 ENCOUNTER — Encounter (HOSPITAL_BASED_OUTPATIENT_CLINIC_OR_DEPARTMENT_OTHER): Payer: Self-pay | Admitting: Obstetrics and Gynecology

## 2020-04-22 NOTE — Progress Notes (Signed)
YOU ARE SCHEDULED FOR A COVID TEST  04-27-2020 @820  AM. THIS TEST MUST BE DONE BEFORE SURGERY. GO TO  [place]. [place], [place], IT IS APPROXIMATELY 2 MINUTES PAST [employer] ON THE RIGHT AND REMAIN IN YOUR CAR, THIS IS A DRIVE UP TEST. ONCE YOUR COVID TEST IS DONE PLEASE FOLLOW ALL THE QUARANTINE  INSTRUCTIONS GIVEN IN YOUR HANDOUT.      Your procedure is scheduled on 04-29-2020  Report to [facility].   Call this number if you have problems the morning of surgery  :[phone].   OUR ADDRESS IS [address].  WE ARE LOCATED IN [facility].  PLEASE BRING YOUR INSURANCE CARD AND PHOTO ID [place] OF SURGERY.  ONLY ONE PERSON ALLOWED IN FACILITY WAITING AREA.                                     REMEMBER:  DRINK 2 PRESURGERY ENSURE DRINKS THE NIGHT BEFORE SURGERY AT  1000 PM AND 1 PRESURGERY DRINK THE [place] OF THE PROCEDURE 3 HOURS PRIOR TO SCHEDULED SURGERY. NO SOLIDS AFTER MIDNIGHT THE [place] PRIOR TO THE SURGERY. NOTHING BY MOUTH EXCEPT CLEAR LIQUIDS UNTIL  430 AM.THREE HOURS PRIOR TO SCHEDULED SURGERY. PLEASE FINISH PRESURGERY ENSURE DRINK PER SURGEON ORDER WHICH NEEDS TO BE COMPLETED AT 430 AM. DO NOT EAT FOOD, CANDY GUM OR MINTS  AFTER MIDNIGHT .    YOU MAY  BRUSH YOUR TEETH MORNING OF SURGERY AND RINSE YOUR MOUTH OUT, NO CHEWING GUM CANDY OR MINTS.    CLEAR LIQUID DIET   Foods Allowed                                                                     Foods Excluded  Coffee and tea, regular and decaf                             liquids that you cannot  Plain Jell-O any favor except red or purple                                           see through such as: Fruit ices (not with fruit pulp)                                     milk, soups, orange juice  Iced Popsicles                                    All solid food Carbonated beverages, regular and diet                                    Cranberry, grape and apple  juices Sports drinks like Gatorade Lightly  seasoned clear broth or consume(fat free) Sugar, honey syrup  Sample Menu Breakfast                                Lunch                                     Supper Cranberry juice                    Beef broth                            Chicken broth Jell-O                                     Grape juice                           Apple juice Coffee or tea                        Jell-O                                      Popsicle                                                Coffee or tea                        Coffee or tea  _____________________________________________________________________     TAKE THESE MEDICATIONS MORNING OF SURGERY WITH A SIP OF WATER: NEBIVOLOL, AMLODIPINE, CITALOPRAM.  ONE VISITOR IS ALLOWED IN WAITING ROOM ONLY [place] OF SURGERY.  NO VISITOR MAY SPEND THE NIGHT.  VISITOR ARE ALLOWED TO STAY UNTIL 800 PM.                                    DO NOT WEAR JEWERLY, MAKE UP. DO NOT WEAR LOTIONS, POWDERS, PERFUMES OR DEODORANT. DO NOT SHAVE FOR 24 HOURS PRIOR TO [place] OF SURGERY. MEN MAY SHAVE FACE AND NECK. CONTACTS, GLASSES, OR DENTURES MAY NOT BE WORN TO SURGERY.                                    [facility] IS NOT RESPONSIBLE  FOR ANY BELONGINGS.                                                                    Marland Kitchen           [facility] - Preparing for Surgery Before surgery, you can play an  important role.  Because skin is not sterile, your skin needs to be as free of germs as possible.  You can reduce the number of germs on your skin by washing with CHG (chlorahexidine gluconate) soap before surgery.  CHG is an antiseptic cleaner which kills germs and bonds with the skin to continue killing germs even after washing. Please DO NOT use if you have an allergy to CHG or antibacterial soaps.  If your skin becomes reddened/irritated stop using the CHG and inform your nurse when you arrive at Short Stay. Do not shave (including  legs and underarms) for at least 48 hours prior to the first CHG shower.  You may shave your face/neck. Please follow these instructions carefully:  1.  Shower with CHG Soap the night before surgery and the  morning of Surgery.  2.  If you choose to wash your hair, wash your hair first as usual with your  normal  shampoo.  3.  After you shampoo, rinse your hair and body thoroughly to remove the  shampoo.                            4.  Use CHG as you would any other liquid soap.  You can apply chg directly  to the skin and wash                      Gently with a scrungie or clean washcloth.  5.  Apply the CHG Soap to your body ONLY FROM THE NECK DOWN.   Do not use on face/ open                           Wound or open sores. Avoid contact with eyes, ears mouth and genitals (private parts).                       Wash face,  Genitals (private parts) with your normal soap.             6.  Wash thoroughly, paying special attention to the area where your surgery  will be performed.  7.  Thoroughly rinse your body with warm water from the neck down.  8.  DO NOT shower/wash with your normal soap after using and rinsing off  the CHG Soap.                9.  Pat yourself dry with a clean towel.            10.  Wear clean pajamas.            11.  Place clean sheets on your bed the night of your first shower and do not  sleep with pets. [place] of Surgery : Do not apply any lotions/deodorants the morning of surgery.  Please wear clean clothes to the hospital/surgery center.  FAILURE TO FOLLOW THESE INSTRUCTIONS MAY RESULT IN THE CANCELLATION OF YOUR SURGERY PATIENT SIGNATURE_________________________________  NURSE SIGNATURE__________________________________  ________________________________________________________________________                                                        QUESTIONS Hansel Feinstein PRE OP NURSE PHONE [phone]

## 2020-04-22 NOTE — Progress Notes (Addendum)
Spoke w/ via phone for pre-op interview---pt Lab needs dos---- urine preg              Lab results------has lab appt 04-27-2020 910 for cbc bmp T & S , ekg  COVID test ------04-27-2020 820 am Arrive at -------530 am 04-29-2020 NPO after MN NO Solid Food. 2 ensure pre surgery drinks at 1000 pm night before surgery  Clear liquids from MN until---430 am drink last ensure presurgery drink at 430 am then npo Med rec completed Medications to take morning of surgery -----nebivolol, amlodipine, citalopram Diabetic medication ----- Patient instructed to bring photo id and insurance card [place] of surgery Patient aware to have Driver (ride ) / caregiver   Patient aware and will arrange driver and caregiver  for 24 hours after surgery  Patient Special Instructions: pt given extended recovery instructions Pre-Op special Istructions -----none Patient verbalized understanding of instructions that were given at this phone interview. Patient denies shortness of breath, chest pain, fever, cough at this phone interview.

## 2020-04-27 ENCOUNTER — Other Ambulatory Visit (HOSPITAL_COMMUNITY)
Admission: RE | Admit: 2020-04-27 | Discharge: 2020-04-27 | Disposition: A | Discharge status: Home / Self Care | Payer: 59 | Source: Ambulatory Visit | Attending: Obstetrics and Gynecology | Admitting: Obstetrics and Gynecology

## 2020-04-27 ENCOUNTER — Encounter (HOSPITAL_COMMUNITY)
Admission: RE | Admit: 2020-04-27 | Discharge: 2020-04-27 | Disposition: A | Discharge status: Home / Self Care | Payer: 59 | Source: Ambulatory Visit | Attending: Obstetrics and Gynecology | Admitting: Obstetrics and Gynecology

## 2020-04-27 ENCOUNTER — Other Ambulatory Visit: Payer: Self-pay

## 2020-04-27 DIAGNOSIS — Z20822 Contact with and (suspected) exposure to covid-19: Secondary | ICD-10-CM | POA: Insufficient documentation

## 2020-04-27 DIAGNOSIS — Z01818 Encounter for other preprocedural examination: Secondary | ICD-10-CM | POA: Insufficient documentation

## 2020-04-27 LAB — BASIC METABOLIC PANEL
Anion gap: 8 (ref 5–15)
BUN: 10 mg/dL (ref 6–20)
CO2: 22 mmol/L (ref 22–32)
Calcium: 8.6 mg/dL — ABNORMAL LOW (ref 8.9–10.3)
Chloride: 111 mmol/L (ref 98–111)
Creatinine, Ser: 0.54 mg/dL (ref 0.44–1.00)
GFR, Estimated: >60 mL/min (ref >60)
Glucose, Bld: 121 mg/dL — ABNORMAL HIGH (ref 70–99)
Potassium: 4 mmol/L (ref 3.5–5.1)
Sodium: 141 mmol/L (ref 135–145)

## 2020-04-27 LAB — CBC
HCT: 39.8 % (ref 36.0–46.0)
Hemoglobin: 13.2 g/dL (ref 12.0–15.0)
MCH: 25.9 pg — ABNORMAL LOW (ref 26.0–34.0)
MCHC: 33.2 g/dL (ref 30.0–36.0)
MCV: 78 fL — ABNORMAL LOW (ref 80.0–100.0)
Platelets: 387 10*3/uL (ref 150–400)
RBC: 5.1 MIL/uL (ref 3.87–5.11)
RDW: 19.5 % — ABNORMAL HIGH (ref 11.5–15.5)
WBC: 13.4 10*3/uL — ABNORMAL HIGH (ref 4.0–10.5)
nRBC: 0 % (ref 0.0–0.2)

## 2020-04-27 LAB — SARS CORONAVIRUS 2 (TAT 6-24 HRS): SARS Coronavirus 2: NEGATIVE

## 2020-04-29 ENCOUNTER — Ambulatory Visit (HOSPITAL_BASED_OUTPATIENT_CLINIC_OR_DEPARTMENT_OTHER): Payer: 59 | Admitting: Certified Registered Nurse Anesthetist

## 2020-04-29 ENCOUNTER — Encounter (HOSPITAL_BASED_OUTPATIENT_CLINIC_OR_DEPARTMENT_OTHER): Payer: Self-pay | Admitting: Obstetrics and Gynecology

## 2020-04-29 ENCOUNTER — Encounter (HOSPITAL_BASED_OUTPATIENT_CLINIC_OR_DEPARTMENT_OTHER)
Admission: RE | Disposition: A | Discharge status: Home / Self Care | Payer: Self-pay | Source: Other Acute Inpatient Hospital | Attending: Obstetrics and Gynecology

## 2020-04-29 ENCOUNTER — Other Ambulatory Visit: Payer: Self-pay

## 2020-04-29 ENCOUNTER — Ambulatory Visit (HOSPITAL_BASED_OUTPATIENT_CLINIC_OR_DEPARTMENT_OTHER)
Admission: RE | Admit: 2020-04-29 | Discharge: 2020-04-29 | Disposition: A | Discharge status: Home / Self Care | Payer: 59 | Source: Other Acute Inpatient Hospital | Attending: Obstetrics and Gynecology | Admitting: Obstetrics and Gynecology

## 2020-04-29 DIAGNOSIS — I10 Essential (primary) hypertension: Secondary | ICD-10-CM | POA: Diagnosis not present

## 2020-04-29 DIAGNOSIS — Z820 Family history of epilepsy and other diseases of the nervous system: Secondary | ICD-10-CM | POA: Insufficient documentation

## 2020-04-29 DIAGNOSIS — D252 Subserosal leiomyoma of uterus: Secondary | ICD-10-CM | POA: Insufficient documentation

## 2020-04-29 DIAGNOSIS — Z832 Family history of diseases of the blood and blood-forming organs and certain disorders involving the immune mechanism: Secondary | ICD-10-CM | POA: Diagnosis not present

## 2020-04-29 DIAGNOSIS — Z803 Family history of malignant neoplasm of breast: Secondary | ICD-10-CM | POA: Insufficient documentation

## 2020-04-29 DIAGNOSIS — N920 Excessive and frequent menstruation with regular cycle: Secondary | ICD-10-CM | POA: Insufficient documentation

## 2020-04-29 DIAGNOSIS — Z833 Family history of diabetes mellitus: Secondary | ICD-10-CM | POA: Diagnosis not present

## 2020-04-29 DIAGNOSIS — Z79899 Other long term (current) drug therapy: Secondary | ICD-10-CM | POA: Insufficient documentation

## 2020-04-29 DIAGNOSIS — D573 Sickle-cell trait: Secondary | ICD-10-CM | POA: Insufficient documentation

## 2020-04-29 DIAGNOSIS — Z8249 Family history of ischemic heart disease and other diseases of the circulatory system: Secondary | ICD-10-CM | POA: Diagnosis not present

## 2020-04-29 DIAGNOSIS — D259 Leiomyoma of uterus, unspecified: Secondary | ICD-10-CM | POA: Diagnosis present

## 2020-04-29 DIAGNOSIS — Z87891 Personal history of nicotine dependence: Secondary | ICD-10-CM | POA: Diagnosis not present

## 2020-04-29 DIAGNOSIS — D219 Benign neoplasm of connective and other soft tissue, unspecified: Secondary | ICD-10-CM | POA: Diagnosis present

## 2020-04-29 HISTORY — PX: ROBOT ASSISTED MYOMECTOMY: SHX5142

## 2020-04-29 HISTORY — DX: Tachycardia, unspecified: R00.0

## 2020-04-29 HISTORY — DX: Frequency of micturition: R35.0

## 2020-04-29 HISTORY — DX: Benign neoplasm of connective and other soft tissue, unspecified: D21.9

## 2020-04-29 HISTORY — DX: Anemia, unspecified: D64.9

## 2020-04-29 HISTORY — DX: Anxiety disorder, unspecified: F41.9

## 2020-04-29 HISTORY — DX: Depression, unspecified: F32.A

## 2020-04-29 LAB — TYPE AND SCREEN
ABO/RH(D): O POS
Antibody Screen: NEGATIVE

## 2020-04-29 LAB — POCT PREGNANCY, URINE: Preg Test, Ur: NEGATIVE

## 2020-04-29 LAB — ABO/RH: ABO/RH(D): O POS

## 2020-04-29 SURGERY — MYOMECTOMY, ROBOT-ASSISTED
Anesthesia: General | Site: Abdomen

## 2020-04-29 MED ORDER — SUGAMMADEX SODIUM 200 MG/2ML IV SOLN
INTRAVENOUS | Status: DC | PRN
  Administered 2020-04-29: 200 mg via INTRAVENOUS

## 2020-04-29 MED ORDER — ACETAMINOPHEN 500 MG PO TABS
ORAL_TABLET | ORAL | Status: AC
  Filled 2020-04-29: qty 2

## 2020-04-29 MED ORDER — EPHEDRINE SULFATE-NACL 50-0.9 MG/10ML-% IV SOSY
PREFILLED_SYRINGE | INTRAVENOUS | Status: DC | PRN
  Administered 2020-04-29: 15 mg via INTRAVENOUS
  Administered 2020-04-29: 10 mg via INTRAVENOUS

## 2020-04-29 MED ORDER — ROCURONIUM BROMIDE 10 MG/ML (PF) SYRINGE
PREFILLED_SYRINGE | INTRAVENOUS | Status: DC | PRN
  Administered 2020-04-29: 10 mg via INTRAVENOUS
  Administered 2020-04-29: 80 mg via INTRAVENOUS
  Administered 2020-04-29 (×5): 20 mg via INTRAVENOUS
  Administered 2020-04-29: 10 mg via INTRAVENOUS

## 2020-04-29 MED ORDER — SIMETHICONE 80 MG PO CHEW: 80.0000 mg | CHEWABLE_TABLET | Freq: Four times a day (QID) | ORAL | Status: DC | PRN

## 2020-04-29 MED ORDER — LACTATED RINGERS IV SOLN: INTRAVENOUS | Status: DC

## 2020-04-29 MED ORDER — KETOROLAC TROMETHAMINE 30 MG/ML IJ SOLN
INTRAMUSCULAR | Status: AC
  Filled 2020-04-29: qty 1

## 2020-04-29 MED ORDER — ONDANSETRON HCL 4 MG/2ML IJ SOLN
INTRAMUSCULAR | Status: DC | PRN
  Administered 2020-04-29: 4 mg via INTRAVENOUS

## 2020-04-29 MED ORDER — FENTANYL CITRATE (PF) 250 MCG/5ML IJ SOLN
INTRAMUSCULAR | Status: DC | PRN
  Administered 2020-04-29: 50 ug via INTRAVENOUS
  Administered 2020-04-29: 100 ug via INTRAVENOUS
  Administered 2020-04-29 (×2): 50 ug via INTRAVENOUS

## 2020-04-29 MED ORDER — ENSURE PRE-SURGERY PO LIQD
296.0000 mL | Freq: Once | ORAL | Status: DC
Start: 2020-04-30 — End: 2020-04-29

## 2020-04-29 MED ORDER — ROCURONIUM BROMIDE 10 MG/ML (PF) SYRINGE
PREFILLED_SYRINGE | INTRAVENOUS | Status: AC
  Filled 2020-04-29: qty 10

## 2020-04-29 MED ORDER — VASOPRESSIN 20 UNIT/ML IV SOLN
INTRAVENOUS | Status: DC | PRN
  Administered 2020-04-29: 15 mL via INTRAMUSCULAR
  Administered 2020-04-29: 20 mL via INTRAMUSCULAR
  Administered 2020-04-29: 4 mL via INTRAMUSCULAR
  Administered 2020-04-29: 15 mL via INTRAMUSCULAR
  Administered 2020-04-29: 8 mL via INTRAMUSCULAR

## 2020-04-29 MED ORDER — LIDOCAINE HCL (PF) 2 % IJ SOLN
INTRAMUSCULAR | Status: DC | PRN
  Administered 2020-04-29: 1 mg/kg/h via INTRADERMAL

## 2020-04-29 MED ORDER — OXYCODONE HCL 5 MG PO TABS: 5.0000 mg | ORAL_TABLET | ORAL | 0 refills | Status: DC | PRN

## 2020-04-29 MED ORDER — PROPOFOL 10 MG/ML IV BOLUS
INTRAVENOUS | Status: AC
  Filled 2020-04-29: qty 20

## 2020-04-29 MED ORDER — HYDROMORPHONE HCL 1 MG/ML IJ SOLN
INTRAMUSCULAR | Status: AC
  Filled 2020-04-29: qty 1

## 2020-04-29 MED ORDER — ONDANSETRON HCL 4 MG/2ML IJ SOLN: 4.0000 mg | Freq: Four times a day (QID) | INTRAMUSCULAR | Status: DC | PRN

## 2020-04-29 MED ORDER — KETAMINE HCL 50 MG/5ML IJ SOSY
PREFILLED_SYRINGE | INTRAMUSCULAR | Status: AC
  Filled 2020-04-29: qty 5

## 2020-04-29 MED ORDER — CEFAZOLIN SODIUM 1 G IJ SOLR
INTRAMUSCULAR | Status: AC
  Filled 2020-04-29: qty 20

## 2020-04-29 MED ORDER — ACETAMINOPHEN 500 MG PO TABS: ORAL_TABLET | ORAL | 1 refills | Status: AC

## 2020-04-29 MED ORDER — GABAPENTIN 300 MG PO CAPS
ORAL_CAPSULE | ORAL | Status: AC
  Filled 2020-04-29: qty 1

## 2020-04-29 MED ORDER — CEFAZOLIN SODIUM-DEXTROSE 2-4 GM/100ML-% IV SOLN
INTRAVENOUS | Status: AC
  Filled 2020-04-29: qty 100

## 2020-04-29 MED ORDER — MIDAZOLAM HCL 2 MG/2ML IJ SOLN
INTRAMUSCULAR | Status: AC
  Filled 2020-04-29: qty 2

## 2020-04-29 MED ORDER — POVIDONE-IODINE 10 % EX SWAB: 2.0000 "application " | Freq: Once | CUTANEOUS | Status: DC

## 2020-04-29 MED ORDER — EPHEDRINE 5 MG/ML INJ
INTRAVENOUS | Status: AC
  Filled 2020-04-29: qty 10

## 2020-04-29 MED ORDER — ENSURE PRE-SURGERY PO LIQD: 592.0000 mL | Freq: Once | ORAL | Status: DC

## 2020-04-29 MED ORDER — ACETAMINOPHEN 500 MG PO TABS
1000.0000 mg | ORAL_TABLET | Freq: Four times a day (QID) | ORAL | Status: DC
  Administered 2020-04-29: 1000 mg via ORAL

## 2020-04-29 MED ORDER — DOCUSATE SODIUM 100 MG PO CAPS
ORAL_CAPSULE | ORAL | Status: AC
  Filled 2020-04-29: qty 1

## 2020-04-29 MED ORDER — DROPERIDOL 2.5 MG/ML IJ SOLN
INTRAMUSCULAR | Status: DC | PRN
  Administered 2020-04-29: .625 mg via INTRAVENOUS

## 2020-04-29 MED ORDER — KETOROLAC TROMETHAMINE 30 MG/ML IJ SOLN
30.0000 mg | Freq: Once | INTRAMUSCULAR | Status: AC
  Administered 2020-04-29: 30 mg via INTRAVENOUS

## 2020-04-29 MED ORDER — DEXAMETHASONE SODIUM PHOSPHATE 10 MG/ML IJ SOLN
INTRAMUSCULAR | Status: DC | PRN
  Administered 2020-04-29: 10 mg via INTRAVENOUS

## 2020-04-29 MED ORDER — OXYCODONE HCL 5 MG PO TABS
ORAL_TABLET | ORAL | Status: AC
  Filled 2020-04-29: qty 1

## 2020-04-29 MED ORDER — SODIUM CHLORIDE 0.9 % IR SOLN
Status: DC | PRN
  Administered 2020-04-29: 3000 mL

## 2020-04-29 MED ORDER — PHENYLEPHRINE 40 MCG/ML (10ML) SYRINGE FOR IV PUSH (FOR BLOOD PRESSURE SUPPORT)
PREFILLED_SYRINGE | INTRAVENOUS | Status: AC
  Filled 2020-04-29: qty 10

## 2020-04-29 MED ORDER — OXYCODONE HCL 5 MG PO TABS
5.0000 mg | ORAL_TABLET | ORAL | Status: DC | PRN
  Administered 2020-04-29: 5 mg via ORAL

## 2020-04-29 MED ORDER — GABAPENTIN 300 MG PO CAPS
300.0000 mg | ORAL_CAPSULE | ORAL | Status: AC
  Administered 2020-04-29: 300 mg via ORAL

## 2020-04-29 MED ORDER — CEFAZOLIN SODIUM-DEXTROSE 2-4 GM/100ML-% IV SOLN
2.0000 g | INTRAVENOUS | Status: AC
  Administered 2020-04-29 (×2): 2 g via INTRAVENOUS

## 2020-04-29 MED ORDER — SCOPOLAMINE 1 MG/3DAYS TD PT72
MEDICATED_PATCH | TRANSDERMAL | Status: AC
  Filled 2020-04-29: qty 1

## 2020-04-29 MED ORDER — PHENYLEPHRINE 40 MCG/ML (10ML) SYRINGE FOR IV PUSH (FOR BLOOD PRESSURE SUPPORT)
PREFILLED_SYRINGE | INTRAVENOUS | Status: DC | PRN
  Administered 2020-04-29 (×2): 80 ug via INTRAVENOUS
  Administered 2020-04-29: 40 ug via INTRAVENOUS

## 2020-04-29 MED ORDER — CELECOXIB 200 MG PO CAPS
400.0000 mg | ORAL_CAPSULE | ORAL | Status: AC
  Administered 2020-04-29: 400 mg via ORAL

## 2020-04-29 MED ORDER — KETOROLAC TROMETHAMINE 30 MG/ML IJ SOLN
INTRAMUSCULAR | Status: DC | PRN
  Administered 2020-04-29: 30 mg via INTRAVENOUS

## 2020-04-29 MED ORDER — ONDANSETRON HCL 4 MG/2ML IJ SOLN
INTRAMUSCULAR | Status: AC
  Filled 2020-04-29: qty 2

## 2020-04-29 MED ORDER — IBUPROFEN 600 MG PO TABS: ORAL_TABLET | ORAL | 1 refills | Status: DC

## 2020-04-29 MED ORDER — ACETAMINOPHEN 500 MG PO TABS
1000.0000 mg | ORAL_TABLET | ORAL | Status: AC
  Administered 2020-04-29: 1000 mg via ORAL

## 2020-04-29 MED ORDER — SUCCINYLCHOLINE CHLORIDE 200 MG/10ML IV SOSY
PREFILLED_SYRINGE | INTRAVENOUS | Status: AC
  Filled 2020-04-29: qty 10

## 2020-04-29 MED ORDER — SODIUM CHLORIDE 0.9 % IV SOLN
INTRAVENOUS | Status: DC | PRN
  Administered 2020-04-29: 120 mL

## 2020-04-29 MED ORDER — PROPOFOL 10 MG/ML IV BOLUS
INTRAVENOUS | Status: DC | PRN
  Administered 2020-04-29: 150 mg via INTRAVENOUS

## 2020-04-29 MED ORDER — LIDOCAINE 2% (20 MG/ML) 5 ML SYRINGE
INTRAMUSCULAR | Status: AC
  Filled 2020-04-29: qty 5

## 2020-04-29 MED ORDER — DEXAMETHASONE SODIUM PHOSPHATE 10 MG/ML IJ SOLN
INTRAMUSCULAR | Status: AC
  Filled 2020-04-29: qty 1

## 2020-04-29 MED ORDER — KETAMINE HCL 10 MG/ML IJ SOLN
INTRAMUSCULAR | Status: DC | PRN
  Administered 2020-04-29 (×2): 10 mg via INTRAVENOUS
  Administered 2020-04-29: 20 mg via INTRAVENOUS
  Administered 2020-04-29: 10 mg via INTRAVENOUS

## 2020-04-29 MED ORDER — ONDANSETRON HCL 4 MG PO TABS
ORAL_TABLET | ORAL | Status: AC
  Filled 2020-04-29: qty 1

## 2020-04-29 MED ORDER — FENTANYL CITRATE (PF) 250 MCG/5ML IJ SOLN
INTRAMUSCULAR | Status: AC
  Filled 2020-04-29: qty 5

## 2020-04-29 MED ORDER — ONDANSETRON HCL 4 MG PO TABS
4.0000 mg | ORAL_TABLET | Freq: Four times a day (QID) | ORAL | Status: DC | PRN
  Administered 2020-04-29: 4 mg via ORAL

## 2020-04-29 MED ORDER — CELECOXIB 200 MG PO CAPS
ORAL_CAPSULE | ORAL | Status: AC
  Filled 2020-04-29: qty 2

## 2020-04-29 MED ORDER — MIDAZOLAM HCL 5 MG/5ML IJ SOLN
INTRAMUSCULAR | Status: DC | PRN
  Administered 2020-04-29: 2 mg via INTRAVENOUS

## 2020-04-29 MED ORDER — SCOPOLAMINE 1 MG/3DAYS TD PT72
1.0000 | MEDICATED_PATCH | TRANSDERMAL | Status: DC
  Administered 2020-04-29: 1.5 mg via TRANSDERMAL

## 2020-04-29 MED ORDER — DOCUSATE SODIUM 100 MG PO CAPS
100.0000 mg | ORAL_CAPSULE | Freq: Two times a day (BID) | ORAL | Status: DC
  Administered 2020-04-29: 100 mg via ORAL

## 2020-04-29 MED ORDER — LIDOCAINE 2% (20 MG/ML) 5 ML SYRINGE
INTRAMUSCULAR | Status: AC
  Filled 2020-04-29: qty 10

## 2020-04-29 MED ORDER — HYDROMORPHONE HCL 1 MG/ML IJ SOLN
0.2500 mg | INTRAMUSCULAR | Status: DC | PRN
  Administered 2020-04-29: 0.5 mg via INTRAVENOUS

## 2020-04-29 MED ORDER — MENTHOL 3 MG MT LOZG: 1.0000 | LOZENGE | OROMUCOSAL | Status: DC | PRN

## 2020-04-29 MED ORDER — ONDANSETRON HCL 4 MG/2ML IJ SOLN
4.0000 mg | Freq: Once | INTRAMUSCULAR | Status: AC | PRN
  Administered 2020-04-29: 4 mg via INTRAVENOUS

## 2020-04-29 MED ORDER — LIDOCAINE 2% (20 MG/ML) 5 ML SYRINGE
INTRAMUSCULAR | Status: DC | PRN
  Administered 2020-04-29: 100 mg via INTRAVENOUS

## 2020-04-29 SURGICAL SUPPLY — 76 items
ADH SKN CLS APL DERMABOND .7 (GAUZE/BANDAGES/DRESSINGS) ×1
BAG SPEC RTRVL LRG 6X4 10 (ENDOMECHANICALS) ×1
BARRIER ADHS 3X4 INTERCEED (GAUZE/BANDAGES/DRESSINGS) ×2 IMPLANT
BRR ADH 4X3 ABS CNTRL BYND (GAUZE/BANDAGES/DRESSINGS) ×1
CNTNR URN SCR LID CUP LEK RST (MISCELLANEOUS) ×1 IMPLANT
CONT SPEC 4OZ STRL OR WHT (MISCELLANEOUS) ×2
COVER BACK TABLE 60X90IN (DRAPES) ×2 IMPLANT
COVER TIP SHEARS 8 DVNC (MISCELLANEOUS) ×1 IMPLANT
COVER TIP SHEARS 8MM DA VINCI (MISCELLANEOUS) ×2
COVER WAND RF STERILE (DRAPES) ×2 IMPLANT
DECANTER SPIKE VIAL GLASS SM (MISCELLANEOUS) ×4 IMPLANT
DEFOGGER SCOPE WARMER CLEARIFY (MISCELLANEOUS) ×2 IMPLANT
DERMABOND ADVANCED (GAUZE/BANDAGES/DRESSINGS) ×1
DERMABOND ADVANCED .7 DNX12 (GAUZE/BANDAGES/DRESSINGS) ×1 IMPLANT
DRAPE ARM DVNC X/XI (DISPOSABLE) ×4 IMPLANT
DRAPE COLUMN DVNC XI (DISPOSABLE) ×1 IMPLANT
DRAPE DA VINCI XI ARM (DISPOSABLE) ×8
DRAPE DA VINCI XI COLUMN (DISPOSABLE) ×1
DURAPREP 26ML APPLICATOR (WOUND CARE) ×2 IMPLANT
ELECT REM PT RETURN 9FT ADLT (ELECTROSURGICAL) ×2
ELECTRODE REM PT RTRN 9FT ADLT (ELECTROSURGICAL) ×1 IMPLANT
GAUZE 4X4 16PLY RFD (DISPOSABLE) ×2 IMPLANT
GLOVE SURG ENC MOIS LTX SZ6.5 (GLOVE) ×4 IMPLANT
GLOVE SURG LTX SZ6.5 (GLOVE) ×10 IMPLANT
GLOVE SURG UNDER POLY LF SZ6.5 (GLOVE) ×4 IMPLANT
GLOVE SURG UNDER POLY LF SZ7 (GLOVE) ×12 IMPLANT
GOWN STRL REUS W/ TWL LRG LVL3 (GOWN DISPOSABLE) ×1 IMPLANT
GOWN STRL REUS W/TWL LRG LVL3 (GOWN DISPOSABLE) ×8 IMPLANT
IRRIG SUCT STRYKERFLOW 2 WTIP (MISCELLANEOUS) ×2
IRRIGATION SUCT STRKRFLW 2 WTP (MISCELLANEOUS) ×1 IMPLANT
KIT TURNOVER CYSTO (KITS) ×2 IMPLANT
LEGGING LITHOTOMY PAIR STRL (DRAPES) ×2 IMPLANT
MANIPULATOR UTERINE 4.5 ZUMI (MISCELLANEOUS) IMPLANT
NEEDLE HYPO 22GX1.5 SAFETY (NEEDLE) ×2 IMPLANT
NEEDLE SPNL 22GX7 QUINCKE BK (NEEDLE) ×2 IMPLANT
NS IRRIG 1000ML POUR BTL (IV SOLUTION) ×2 IMPLANT
OCCLUDER COLPOPNEUMO (BALLOONS) IMPLANT
PACK ROBOT WH (CUSTOM PROCEDURE TRAY) ×2 IMPLANT
PACK ROBOTIC GOWN (GOWN DISPOSABLE) ×2 IMPLANT
PACK TRENDGUARD 450 HYBRID PRO (MISCELLANEOUS) ×1 IMPLANT
PAD PREP 24X48 CUFFED NSTRL (MISCELLANEOUS) ×2 IMPLANT
PORT ACCESS TROCAR AIRSEAL 12 (TROCAR) IMPLANT
PORT ACCESS TROCAR AIRSEAL 5M (TROCAR)
POUCH SPECIMEN RETRIEVAL 10MM (ENDOMECHANICALS) ×2 IMPLANT
PROTECTOR NERVE ULNAR (MISCELLANEOUS) ×4 IMPLANT
SEAL CANN UNIV 5-8 DVNC XI (MISCELLANEOUS) ×4 IMPLANT
SEAL XI 5MM-8MM UNIVERSAL (MISCELLANEOUS) ×4
SET TRI-LUMEN FLTR TB AIRSEAL (TUBING) ×2 IMPLANT
SOL ANTI FOG 6CC (MISCELLANEOUS) ×1 IMPLANT
SOLUTION ANTI FOG 6CC (MISCELLANEOUS) ×1
STRIP CLOSURE SKIN 1/4X4 (GAUZE/BANDAGES/DRESSINGS) ×2 IMPLANT
SUT DVC VLOC 180 0 12IN GS21 (SUTURE)
SUT DVC VLOC 180 2-0 12IN GS21 (SUTURE)
SUT MNCRL AB 3-0 PS2 27 (SUTURE) ×4 IMPLANT
SUT VIC AB 0 CT2 27 (SUTURE) IMPLANT
SUT VICRYL 0 UR6 27IN ABS (SUTURE) ×6 IMPLANT
SUT VLOC 180 0 6IN GS21 (SUTURE) ×2 IMPLANT
SUT VLOC 180 0 9IN  GS21 (SUTURE) ×10
SUT VLOC 180 0 9IN GS21 (SUTURE) ×5 IMPLANT
SUT VLOC 180 2-0 6IN GS21 (SUTURE) IMPLANT
SUT VLOC 180 2-0 9IN GS21 (SUTURE) ×12 IMPLANT
SUTURE DVC VL 180 2-0 12INGS21 (SUTURE) IMPLANT
SUTURE DVC VLC 180 0 12IN GS21 (SUTURE) IMPLANT
SYS LAPSCP GELPORT 120MM (MISCELLANEOUS) ×2
SYSTEM CARTER THOMASON II (TROCAR) IMPLANT
SYSTEM LAPSCP GELPORT 120MM (MISCELLANEOUS) ×1 IMPLANT
TIP RUMI ORANGE 6.7MMX12CM (TIP) ×2 IMPLANT
TIP UTERINE 5.1X6CM LAV DISP (MISCELLANEOUS) IMPLANT
TIP UTERINE 6.7X10CM GRN DISP (MISCELLANEOUS) IMPLANT
TIP UTERINE 6.7X6CM WHT DISP (MISCELLANEOUS) IMPLANT
TIP UTERINE 6.7X8CM BLUE DISP (MISCELLANEOUS) IMPLANT
TOWEL OR 17X26 10 PK STRL BLUE (TOWEL DISPOSABLE) ×2 IMPLANT
TRAY FOLEY W/BAG SLVR 14FR LF (SET/KITS/TRAYS/PACK) ×2 IMPLANT
TRENDGUARD 450 HYBRID PRO PACK (MISCELLANEOUS) ×2
TROCAR BLADELESS OPT 12M 100M (ENDOMECHANICALS) ×2 IMPLANT
TROCAR PORT AIRSEAL 8X120 (TROCAR) ×2 IMPLANT

## 2020-04-29 NOTE — Transfer of Care (Signed)
Immediate Anesthesia Transfer of Care Note  Patient: Nancy Valenzuela  Procedure(s) Performed: XI ROBOTIC ASSISTED MYOMECTOMY (N/A Abdomen)  Patient Location: PACU  Anesthesia Type:General  Level of Consciousness: awake, alert , oriented and patient cooperative  Airway & Oxygen Therapy: Patient Spontanous Breathing and Patient connected to face mask oxygen  Post-op Assessment: Report given to RN, Post -op Vital signs reviewed and stable and Patient moving all extremities  Post vital signs: Reviewed and stable  Last Vitals:  Vitals Value Taken Time  BP 142/83 04/29/20 1357  Temp    Pulse 77 04/29/20 1400  Resp 15 04/29/20 1400  SpO2 100 % 04/29/20 1400  Vitals shown include unvalidated device data.  Last Pain:  Vitals:   04/29/20 0611  TempSrc: Oral  PainSc: 0-No pain      Patients Stated Pain Goal: 5 (30/86/57 [id number])  Complications: No complications documented.

## 2020-04-29 NOTE — Interval H&P Note (Signed)
History and Physical Interval Note:  04/29/2020 7:36 AM  Nancy Valenzuela  has presented today for surgery, with the diagnosis of fibroids.  The various methods of treatment have been discussed with the patient and family. After consideration of risks, benefits and other options for treatment, the patient has consented to  Procedure(s): XI ROBOTIC ASSISTED MYOMECTOMY (N/A) as a surgical intervention.  The patient's history has been reviewed, patient examined, no change in status, stable for surgery.  I have reviewed the patient's chart and labs.  Questions were answered to the patient's satisfaction.     Katharine Look A Laree Garron

## 2020-04-29 NOTE — Op Note (Signed)
Preoperative diagnosis: Symptomatic uterine fibroids   Postoperative diagnosis: Same   Anesthesia: General   Anesthesiologist: Dr. Kalman Shan  Procedure: Robotically assisted myomectomy   Surgeon: Dr. Katharine Look Hilma Steinhilber   Assistant: Earnstine Regal P.A.-C .  Estimated blood loss: 50 cc   Procedure:   After being informed of the planned procedure with possible complications including but not limited to bleeding, infection, injury to other organs, need for laparotomy, possible need for morcellation with risks and benefits reviewed, expected hospital stay and recovery, informed consent is obtained and patient is taken to or #5. She is placed in lithotomy position on [place] with both arms padded and tucked on each side and bilateral knee-high sequential compressive devices. She is given general anesthesia with endotracheal intubation without any complication. She is prepped and draped in a sterile fashion. A three-way Foley catheter is inserted in her bladder.   Pelvic exam reveals: 14 week size uterus with no pelvic mass  A weighted speculum is inserted in the vagina and the anterior lip of the cervix is grasped with a tenaculum forcep. We proceed with a paracervical block and vaginal infiltration using ropivacaine 0.5% diluted 1 in 1 with saline. The uterus was then sounded at 14 cm. We easily dilate the cervix using Hegar dilator to #27 which allows for easy placement of the intrauterine RUMI manipulator.  Trocar placement is decided. We infiltrate 2 cm above the umbilicus with 10 cc of ropivacaine per protocol and perform a 10 mm vertical incision which is brought down bluntly to the fascia. The fascia is identified and grasped with Coker forceps. The fascia is incised with Mayo scissors. Peritoneum is entered bluntly. A pursestring suture of 0 Vicryl is placed on the fascia and a 10 mm Hassan trocar is easily inserted in the abdominal cavity held in placed with a Purstring suture. This allows for easy  insufflation of a pneumoperitoneum using warmed CO2 at a maximum pressure of 15 mm of mercury. 60 cc of Ropivacaine 0.5 % diluted 1 in 1 is sent in the pelvis and the patient is positioned in reverse Trendelenburg. We then placed two 60mm robotic trocar on the left, one 81mm robotic trocar on the right and one 10 mm patient's side assistant trocar on the right after infiltrating every site with ropivacaine per protocol. The robot is docked on the right of the patient after positioning in Trendelenburg. A monopolar scissor is inserted in arm #4, a Long bipolar forceps is inserted in arm #2 and a Tenaculum is inserted in arm #1  Preparation and docking is completed in 40 minutes.   Observation: The uterus is enlarged with multiple as described below. Both tubes and ovaries are normal. Both anterior and posterior cul-de-sac are normal. Liver is visualized and normal. Appendix is not visualized. There is a small adhesion between the omentum and the abdominal wall.   Fibroid #1:Posterior subserosal 7.5 cm Fibroid #2: Right fundal 8.5 cm Fibroid #3: Right fundal 1.5 cm Fibroid #4: Fundal subserosal 0.3 cm Fibroid #5: Left posterior 4 cm Fibroid #6: Left cornual 3.5 cm Fibroid #7: Anterior mid body 5.5 cm  We proceed with sharp lysis of omental adhesion and then by infiltrating all the previously described fibroids using vasopressin at a dilution of 20 units in 100 cc of saline until we obtain complete blanching. Using monopolar scissors, the serosa of each fibroid is incised. The fibroid is grasped with Tenaculum. Using sharp and blunt dissection, the fibroid is extracted from the myometrium.   The  myometrial defects are repaired in multiple layers of running sutures of 0 V-Lock. All serosal defects are closed with a baseball suture of 2-0 V-Lock.  We irrigated profusely to confirm a satisfactory hemostasis. A sheet of Interceed divided in two is placed to cover the incisions. . Console  time is completed  in 3 hour and 23 minutes.   All instruments are then removed and the robot is undocked.   An Endobag is inserted in the abdominal cavity through the supra-umbilical incision.All fibroids are placed in the bag which is pulled to the abdominal wall.  We then proceed with systematic morcellation of the specimen which takes 37 minutes.   We irrigate profusely and again confirm a satisfactory hemostasis.   All trochars are removed under direct visualization after evacuating the pneumoperitoneum.   The fascia of the supraumbilical incision is closed with the previously placed pursestring suture of 0 Vicryl. All incisions are then closed with subcuticular suture of 3-0 Monocryl and Dermabond.   A speculum is inserted in the vagina to confirm adequate hemostasis on the cervix.   Instrument and sponge count is complete x2. Estimated blood loss is 50 cc.   The procedure is well tolerated by the patient who  is taken to recovery room in a well and stable condition.    Dr Cletis Media was present and scrubbed at all times. Surgical assistance was required due to the complexity of the anatomy and procedure.   NUMBER OF FIBROIDS REMOVED: 7  NUMBER OF MYOMETRIAL INCISIONS: 4  ENDOMETRIAL CAVITY ENTRY: yes  CESAREAN DELIVERY RECOMMENDED: yes  Vasopressine: 12.4 units  Specimen: Fibroids weighing 146 g sent to pathology.

## 2020-04-29 NOTE — Anesthesia Postprocedure Evaluation (Signed)
Anesthesia Post Note  Patient: Nancy Valenzuela  Procedure(s) Performed: XI ROBOTIC ASSISTED MYOMECTOMY (N/A Abdomen)     Patient location during evaluation: PACU Anesthesia Type: General Level of consciousness: awake and alert Pain management: pain level controlled Vital Signs Assessment: post-procedure vital signs reviewed and stable Respiratory status: spontaneous breathing, nonlabored ventilation, respiratory function stable and patient connected to nasal cannula oxygen Cardiovascular status: blood pressure returned to baseline and stable Postop Assessment: no apparent nausea or vomiting Anesthetic complications: no   No complications documented.  Last Vitals:  Vitals:   04/29/20 1430 04/29/20 1444  BP: 122/70 126/76  Pulse: 68 70  Resp: 19 15  Temp: 36.4 C (!) 36.3 C  SpO2: 98% 97%    Last Pain:  Vitals:   04/29/20 1417  TempSrc:   PainSc: 1                  [name] S

## 2020-04-29 NOTE — Anesthesia Preprocedure Evaluation (Signed)
Anesthesia Evaluation  Patient identified by MRN, date of birth, ID band Patient awake    Reviewed: Allergy & Precautions, NPO status , Patient's Chart, lab work & pertinent test results  Airway Mallampati: II  TM Distance: >3 FB Neck ROM: Full    Dental no notable dental hx.    Pulmonary Current Smoker,    Pulmonary exam normal breath sounds clear to auscultation       Cardiovascular hypertension, Pt. on medications Normal cardiovascular exam Rhythm:Regular Rate:Normal     Neuro/Psych negative neurological ROS  negative psych ROS   GI/Hepatic negative GI ROS, Neg liver ROS,   Endo/Other  negative endocrine ROS  Renal/GU negative Renal ROS  negative genitourinary   Musculoskeletal negative musculoskeletal ROS (+)   Abdominal   Peds negative pediatric ROS (+)  Hematology negative hematology ROS (+)   Anesthesia Other Findings   Reproductive/Obstetrics negative OB ROS                             Anesthesia Physical Anesthesia Plan  ASA: II  Anesthesia Plan: General   Post-op Pain Management:    Induction: Intravenous  PONV Risk Score and Plan: 3 and Ondansetron, Dexamethasone, Midazolam, Treatment may vary due to age or medical condition and Scopolamine patch - Pre-op  Airway Management Planned: Oral ETT  Additional Equipment:   Intra-op Plan:   Post-operative Plan: Extubation in OR  Informed Consent: I have reviewed the patients History and Physical, chart, labs and discussed the procedure including the risks, benefits and alternatives for the proposed anesthesia with the patient or authorized representative who has indicated his/her understanding and acceptance.     Dental advisory given  Plan Discussed with: CRNA and Surgeon  Anesthesia Plan Comments:         Anesthesia Quick Evaluation

## 2020-04-29 NOTE — Discharge Instructions (Signed)
Call [facility] @ [phone] if:  You have a temperature greater than or equal to 100.4 degrees Farenheit orally You have pain that is not made better by the pain medication given and taken as directed You have excessive bleeding or problems urinating  Take Colace (Docusate Sodium/Stool Softener) 100 mg 2-3 times daily while taking narcotic pain medicine to avoid constipation or until bowel movements are regular. Take, with food,  Ibuprofen 600 mg along with Tylenol  #2 (500 mg) every 6 hours for 5 days then as needed for pain  You may drive after 2  weeks You may walk up steps  You may shower tomorrow You may resume a regular diet  Keep incisions clean and dry Do not lift over 15 pounds for 6 weeks Avoid anything in vagina for 6 weeks (or until after your post-operative visit)    Myomectomy, Care After The following information offers guidance on how to care for yourself after your procedure. Your health care provider may also give you more specific instructions. If you have problems or questions, contact your health care provider. What can I expect after the procedure? After the procedure, it is common to have:  Pain in your abdomen and at the incision sites.  Vaginal bleeding. Follow these instructions at home: Medicines  Take over-the-counter and prescription medicines only as told by your health care provider.  If you were prescribed an antibiotic medicine, take it as told by your health care provider. Do not stop using the antibiotic even if you start to feel better.  If you are taking blood thinners: ? Talk with your health care provider before you take any medicines that contain aspirin or NSAIDs, such as ibuprofen. These medicines increase your risk for dangerous bleeding. ? Take your medicine exactly as told, at the same time every [place]. ? Avoid activities that could cause injury or bruising, and follow instructions about how to prevent falls. ? Wear a  medical alert bracelet or carry a card that lists what medicines you take.  Ask your health care provider if the medicine prescribed to you: ? Requires you to avoid driving or using machinery. ? Can cause constipation. You may need to take these actions to prevent or treat constipation:  Drink enough fluid to keep your urine pale yellow.  Take over-the-counter or prescription medicines.  Eat foods that are high in fiber, such as beans, whole grains, and fresh fruits and vegetables.  Limit foods that are high in fat and processed sugars, such as fried or sweet foods. Incision care  Follow instructions from your health care provider about how to take care of any incisions. Make sure you: ? Wash your hands with soap and water for at least 20 seconds before and after you change your bandage (dressing). If soap and water are not available, use hand sanitizer. ? Change your dressing as told by your health care provider. ? Leave stitches (sutures), skin glue, or adhesive strips in place. These skin closures may need to stay in place for 2 weeks or longer. If adhesive strip edges start to loosen and curl up, you may trim the loose edges. Do not remove adhesive strips completely unless your health care provider tells you to do that.  Check your incision areas every [place] for signs of infection. Check for: ? Redness, swelling, or pain. ? More fluid or blood. ? Warmth. ? Pus or a bad smell.   Activity  Do not lift anything that is heavier than  5 lb (2.3 kg), or the limit that you are told, until your health care provider says that it is safe.  Return to your normal activities as told by your health care provider. Ask your health care provider what activities are safe for you.  Rest as told by your health care provider.  Avoid sitting for a long time without moving. Get up to take short walks every 1-2 hours. This is important to improve blood flow and breathing. Ask for help if you feel weak or  unsteady.  Do not douche, use tampons, or have sexual intercourse until your health care provider approves.  Practice deep breathing and coughing. If it hurts to cough, try holding a pillow against your belly as you cough.   General instructions  Do not use any products that contain nicotine or tobacco. These products include cigarettes, chewing tobacco, and vaping devices, such as e-cigarettes. These can delay incision healing. If you need help quitting, ask your health care provider.  Do not take baths, swim, or use a hot tub until your health care provider approves. Ask your health care provider if you may take showers. You may only be allowed to take sponge baths.  Do not drink alcohol until your health care provider says it is okay.  Wear compression stockings as told by your health care provider. These stockings help to prevent blood clots and reduce swelling in your legs.  Keep all follow-up visits. This is important. Contact a health care provider if:  You have a fever or chills.  You have more redness, swelling, or pain around your incision.  You have fluid, blood, pus, or a bad smell coming from your incision.  Your incision feels warm to the touch.  You have a rash.  You have pain when you urinate or blood in your urine.  You have nausea, vomiting, or diarrhea. Get help right away if:  You have trouble breathing or shortness of breath.  You have chest pain.  You feel weak or light-headed.  You have pain, swelling, or redness in your legs.  You become dizzy and faint.  You have heavy vaginal bleeding or blood clots that are larger than a quarter in size.  You have an incision that is opening up.  You have increasing abdominal pain that does not get better with medicine. These symptoms may represent a serious problem that is an emergency. Do not wait to see if the symptoms will go away. Get medical help right away. Call your local emergency services (911 in  the [place] Do not drive yourself to the hospital. Summary  After the procedure, it is common to have pain at the incision sites.  If you were prescribed an antibiotic medicine, take it as told by your health care provider. Do not stop using the antibiotic even if you start to feel better.  Do not douche, use tampons, or have sexual intercourse until your health care provider approves.  Return to your normal activities as told by your health care provider. Ask your health care provider what activities are safe for you. This information is not intended to replace advice given to you by your health care provider. Make sure you discuss any questions you have with your health care provider. Document Revised: 08/14/2019 Document Reviewed: 08/14/2019 Elsevier Patient Education  2021 [place] Instructions  Activity: Get plenty of rest for the remainder of the [place]. A responsible individual must stay with you for 24  hours following the procedure.  For the next 24 hours, DO NOT: -Drive a car -Paediatric nurse -Drink alcoholic beverages -Take any medication unless instructed by your physician -Make any legal decisions or sign important papers.  Meals: Start with liquid foods such as gelatin or soup. Progress to regular foods as tolerated. Avoid greasy, spicy, heavy foods. If nausea and/or vomiting occur, drink only clear liquids until the nausea and/or vomiting subsides. Call your physician if vomiting continues.  Special Instructions/Symptoms: Your throat may feel dry or sore from the anesthesia or the breathing tube placed in your throat during surgery. If this causes discomfort, gargle with warm salt water. The discomfort should disappear within 24 hours.  If you had a scopolamine patch placed behind your ear for the management of post- operative nausea and/or vomiting:  1. The medication in the patch is effective for 72 hours, after which it should be  removed.  Wrap patch in a tissue and discard in the trash. Wash hands thoroughly with soap and water. 2. You may remove the patch earlier than 72 hours if you experience unpleasant side effects which may include dry mouth, dizziness or visual disturbances. 3. Avoid touching the patch. Wash your hands with soap and water after contact with the patch.

## 2020-04-30 ENCOUNTER — Encounter (HOSPITAL_BASED_OUTPATIENT_CLINIC_OR_DEPARTMENT_OTHER): Payer: Self-pay | Admitting: Obstetrics and Gynecology

## 2020-04-30 LAB — SURGICAL PATHOLOGY

## 2020-10-05 ENCOUNTER — Other Ambulatory Visit: Payer: Self-pay

## 2020-10-05 ENCOUNTER — Ambulatory Visit
Admission: EM | Admit: 2020-10-05 | Discharge: 2020-10-05 | Disposition: A | Discharge status: Home / Self Care | Payer: PRIVATE HEALTH INSURANCE | Attending: Urgent Care | Admitting: Urgent Care

## 2020-10-05 DIAGNOSIS — L732 Hidradenitis suppurativa: Secondary | ICD-10-CM | POA: Diagnosis not present

## 2020-10-05 MED ORDER — CLINDAMYCIN HCL 300 MG PO CAPS: 300.0000 mg | ORAL_CAPSULE | Freq: Three times a day (TID) | ORAL | 0 refills | Status: AC

## 2020-10-05 MED ORDER — NAPROXEN 500 MG PO TABS: 500.0000 mg | ORAL_TABLET | Freq: Two times a day (BID) | ORAL | 0 refills | Status: DC

## 2020-10-05 NOTE — ED Provider Notes (Addendum)
[place]   MRN: [id number] DOB: 06-15-1975  Subjective:   Nancy Valenzuela is a 45 y.o. female presenting for 3-week history of recurrent lesion over the right medial thigh.  Patient states that she has been seeing a dermatologist for recurrent infections over the same areas of her thighs.  Has had this problem for most of her life. Previously has had doxycycline, has also had steroid injections directly into the areas.  Patient has not had a good experience with her dermatology practice at [facility] dermatology.  Would like recommendations for a different practice.  Currently denies fever, drainage, redness.  No current facility-administered medications for this encounter.  Current Outpatient Medications:    acetaminophen (TYLENOL) 500 MG tablet, take 2 tablets po every 6 hours for 5 days then as needed for post operative pain; first dose 8 pm today, Disp: 100 tablet, Rfl: 1   amLODipine (NORVASC) 10 MG tablet, Take 10 mg by mouth daily., Disp: , Rfl:    BIOTIN PO, Take by mouth daily., Disp: , Rfl:    citalopram (CELEXA) 20 MG tablet, Take 20 mg by mouth daily., Disp: , Rfl:    ibuprofen (ADVIL) 600 MG tablet, take 1 tablet po pc every 6 hours for 5 days then prn for post operative pain, Disp: 30 tablet, Rfl: 1   nebivolol (BYSTOLIC) 10 MG tablet, Take 10 mg by mouth daily., Disp: , Rfl:    oxyCODONE (ROXICODONE) 5 MG immediate release tablet, Take 1 tablet (5 mg total) by mouth every 4 (four) hours as needed for severe pain or breakthrough pain (for breakthrough post operative pain)., Disp: 20 tablet, Rfl: 0   Probiotic Product (PROBIOTIC DAILY PO), Take by mouth., Disp: , Rfl:    UNABLE TO FIND, Fusion plus iron 1 tab daily, Disp: , Rfl:    No Known Allergies  Past Medical History:  Diagnosis Date   Anemia    Anxiety    Depression    Fibroid    Heart rate fast since 2021   on nebivilol    Hypertension    Multiple abscesses of both legs hx of due to shaving   Urinary  frequency      Past Surgical History:  Procedure Laterality Date   CESAREAN SECTION     x 2   PILONIDAL CYST EXCISION  2001   ROBOT ASSISTED MYOMECTOMY N/A 04/29/2020   Procedure: XI ROBOTIC ASSISTED MYOMECTOMY;  Surgeon: Delsa Bern, MD;  Location: [place];  Service: Gynecology;  Laterality: N/A;    Family History  Problem Relation Age of Onset   Hypertension Mother    Hypertension Father    Parkinson's disease Father    Parkinsonism Father     Social History   Tobacco Use   Smoking status: Every [place]    Packs/Blakeman: 1.00    Years: 14.00    Pack years: 14.00    Types: Cigarettes    Last attempt to quit: 04/15/2020    Years since quitting: 0.4   Smokeless tobacco: Never  Vaping Use   Vaping Use: Never used  Substance Use Topics   Alcohol use: Yes    Comment: occasion social   Drug use: No    ROS   Objective:   Vitals: BP (!) 148/90 (BP Location: Left Arm)   Pulse 84   Temp 98.3 F (36.8 C) (Oral)   Resp 18   LMP 09/13/2020   SpO2 98%   Physical Exam Constitutional:  General: She is not in acute distress.    Appearance: Normal appearance. She is well-developed. She is not ill-appearing, toxic-appearing or diaphoretic.  HENT:     Head: Normocephalic and atraumatic.     Nose: Nose normal.     Mouth/Throat:     Mouth: Mucous membranes are moist.     Pharynx: Oropharynx is clear.  Eyes:     General: No scleral icterus.    Extraocular Movements: Extraocular movements intact.     Pupils: Pupils are equal, round, and reactive to light.  Cardiovascular:     Rate and Rhythm: Normal rate.  Pulmonary:     Effort: Pulmonary effort is normal.  Skin:    General: Skin is warm and dry.       Neurological:     General: No focal deficit present.     Mental Status: She is alert and oriented to person, place, and time.  Psychiatric:        Mood and Affect: Mood normal.        Behavior: Behavior normal.        Thought Content: Thought  content normal.        Judgment: Judgment normal.   Assessment and Plan :   PDMP not reviewed this encounter.  1. Hidradenitis suppurativa     Area in question is not amenable to incision and drainage.  I discussed with patient the diagnosis of hidradenitis.  She states that she has previously been recommended to go on Humira but feels like no one has told her about having hidradenitis.  Recommended clindamycin and warm compresses for her current flareup.  Advised that she try warm compresses.  Follow-up with a different dermatology practice, provided her with information to different practices. Counseled patient on potential for adverse effects with medications prescribed/recommended today, ER and return-to-clinic precautions discussed, patient verbalized understanding.      [name], [place] 10/05/20 [phone]

## 2020-10-05 NOTE — ED Triage Notes (Signed)
Pt c/o abscess to rt upper thigh x3wks.

## 2020-10-17 ENCOUNTER — Other Ambulatory Visit: Payer: Self-pay

## 2020-10-17 ENCOUNTER — Encounter: Payer: Self-pay | Admitting: Emergency Medicine

## 2020-10-17 ENCOUNTER — Ambulatory Visit
Admission: EM | Admit: 2020-10-17 | Discharge: 2020-10-17 | Disposition: A | Discharge status: Home / Self Care | Payer: 59

## 2020-10-17 DIAGNOSIS — L02415 Cutaneous abscess of right lower limb: Secondary | ICD-10-CM | POA: Diagnosis not present

## 2020-10-17 NOTE — ED Notes (Signed)
Patient left without AVS after seeing provider

## 2020-10-17 NOTE — ED Triage Notes (Signed)
Was recently seen for an abscess on thigh here a few weeks ago. States the meds she took helped, but that she started messing with it again and it got worse. Has appointment with dermatology in october

## 2020-10-17 NOTE — ED Provider Notes (Signed)
[facility]    CSN: [id number] Arrival date & time: 10/17/20  0809      History   Chief Complaint Chief Complaint  Patient presents with   Abscess    HPI 68 Orozco is a 45 y.o. female.   Patient presents with abscess to right inner thigh that has been present for approximately 1 year.  Patient was seen on 10/05/2020 for same abscess.  Was prescribed clindamycin antibiotic and advised to use warm compresses.  Patient has completed antibiotic course and has been using warm compresses.  Patient saw some improvement in abscess, but states that abscess started to come back over the past few days.  Abscess has been draining some purulent drainage per patient.  Denies any fevers or numbness or tingling to right lower extremity.  Patient has appointment October 17 with dermatology for further evaluation and management.   Abscess  Past Medical History:  Diagnosis Date   Anemia    Anxiety    Depression    Fibroid    Heart rate fast since 2021   on nebivilol    Hypertension    Multiple abscesses of both legs hx of due to shaving   Urinary frequency     Patient Active Problem List   Diagnosis Date Noted   Fibroid, uterine 04/29/2020   Fibroids 04/29/2020   Cervical lymphadenopathy 03/06/2018   Multinodular goiter 03/06/2018   Smoking 10/20/2013   Screen for STD (sexually transmitted disease) 08/05/2013   Essential hypertension, benign 08/05/2013    Past Surgical History:  Procedure Laterality Date   CESAREAN SECTION     x 2   PILONIDAL CYST EXCISION  2001   ROBOT ASSISTED MYOMECTOMY N/A 04/29/2020   Procedure: XI ROBOTIC ASSISTED MYOMECTOMY;  Surgeon: Delsa Bern, MD;  Location: [place];  Service: Gynecology;  Laterality: N/A;    OB History   No obstetric history on file.      Home Medications    Prior to Admission medications   Medication Sig Start Date End Date Taking? Authorizing Provider  acetaminophen (TYLENOL) 500 MG tablet  take 2 tablets po every 6 hours for 5 days then as needed for post operative pain; first dose 8 pm today 04/29/20   Earnstine Regal, PA-C  amLODipine (NORVASC) 10 MG tablet Take 10 mg by mouth daily.    [provider]  BIOTIN PO Take by mouth daily.    [provider]  citalopram (CELEXA) 20 MG tablet Take 20 mg by mouth daily.    [provider]  clindamycin (CLEOCIN) 300 MG capsule Take 1 capsule (300 mg total) by mouth 3 (three) times daily. 10/05/20   [name], PA-C  ibuprofen (ADVIL) 600 MG tablet take 1 tablet po pc every 6 hours for 5 days then prn for post operative pain 04/29/20   Earnstine Regal, PA-C  naproxen (NAPROSYN) 500 MG tablet Take 1 tablet (500 mg total) by mouth 2 (two) times daily with a meal. 10/05/20   [name], PA-C  nebivolol (BYSTOLIC) 10 MG tablet Take 10 mg by mouth daily.    [provider]  oxyCODONE (ROXICODONE) 5 MG immediate release tablet Take 1 tablet (5 mg total) by mouth every 4 (four) hours as needed for severe pain or breakthrough pain (for breakthrough post operative pain). 04/29/20   Earnstine Regal, PA-C  Probiotic Product (PROBIOTIC DAILY PO) Take by mouth.    [provider]  UNABLE TO FIND Fusion plus iron 1 tab daily  [provider]    Family History Family History  Problem Relation Age of Onset   Hypertension Mother    Hypertension Father    Parkinson's disease Father    Parkinsonism Father     Social History Social History   Tobacco Use   Smoking status: Every [place]    Packs/[place]: 1.00    Years: 14.00    Pack years: 14.00    Types: Cigarettes    Last attempt to quit: 04/15/2020    Years since quitting: 0.5   Smokeless tobacco: Never  Vaping Use   Vaping Use: Never used  Substance Use Topics   Alcohol use: Yes    Comment: occasion social   Drug use: No     Allergies   Patient has no known allergies.   Review of Systems Review of Systems Per HPI  Physical Exam Triage  Vital Signs ED Triage Vitals [10/17/20 0830]  Enc Vitals Group     BP (!) 161/94     Pulse Rate 85     Resp 16     Temp 98.1 F (36.7 C)     Temp Source Oral     SpO2 99 %     Weight      Height      Head Circumference      Peak Flow      Pain Score 7     Pain Loc      Pain Edu?      Excl. in [place]?    No data found.  Updated Vital Signs BP (!) 161/94 (BP Location: Left Arm)   Pulse 85   Temp 98.1 F (36.7 C) (Oral)   Resp 16   SpO2 99%   Visual Acuity Right Eye Distance:   Left Eye Distance:   Bilateral Distance:    Right Eye Near:   Left Eye Near:    Bilateral Near:     Physical Exam Constitutional:      General: She is not in acute distress.    Appearance: Normal appearance. She is not ill-appearing, toxic-appearing or diaphoretic.  HENT:     Head: Normocephalic and atraumatic.  Eyes:     Extraocular Movements: Extraocular movements intact.     Conjunctiva/sclera: Conjunctivae normal.  Cardiovascular:     Rate and Rhythm: Normal rate and regular rhythm.  Pulmonary:     Effort: Pulmonary effort is normal.     Breath sounds: Normal breath sounds.  Skin:    General: Skin is warm.     Comments: Approximately 0.5 cm x 0.5 cm indurated abscess present to right inner thigh.  Right lower extremity neurovascularly intact.  Neurological:     General: No focal deficit present.     Mental Status: She is alert and oriented to person, place, and time. Mental status is at baseline.  Psychiatric:        Mood and Affect: Mood normal.        Behavior: Behavior normal.        Thought Content: Thought content normal.        Judgment: Judgment normal.     UC Treatments / Results  Labs (all labs ordered are listed, but only abnormal results are displayed) Labs Reviewed - No data to display  EKG   Radiology No results found.  Procedures Procedures (including critical care time)  Medications Ordered in UC Medications - No data to display  Initial Impression  / Assessment and Plan / UC Course  I  have reviewed the triage vital signs and the nursing notes.  Pertinent labs & imaging results that were available during my care of the patient were reviewed by me and considered in my medical decision making (see chart for details).     I&D is not indicated for abscess at this time due to characteristic of abscess and location of abscess.  Abscess has also been draining on its own.  Discussed clinical course with supervising physician (Dr. Lanny Cramp).  Do not think additional course of antibiotics is warranted at this time.  Advised patient to continue warm compresses and to follow-up with dermatology as soon as possible for further evaluation and management.  No red flags seen on exam.  Do not think patient is in immediate need of medical attention at the hospital at this time.Discussed strict return precautions. Patient verbalized understanding and is agreeable with plan.  Patient left before AVS could be supplied to patient. Final Clinical Impressions(s) / UC Diagnoses   Final diagnoses:  Abscess of right thigh     Discharge Instructions      Please continue warm compresses.  Follow-up with dermatology as soon as possible for further evaluation and management.     ED Prescriptions   None    PDMP not reviewed this encounter.   Odis Luster, [place] 10/17/20 [phone]

## 2020-10-17 NOTE — Discharge Instructions (Signed)
Please continue warm compresses.  Follow-up with dermatology as soon as possible for further evaluation and management.

## 2021-06-29 ENCOUNTER — Encounter: Payer: Self-pay | Admitting: Podiatry

## 2021-06-29 ENCOUNTER — Ambulatory Visit: Payer: Commercial Managed Care - HMO | Admitting: Podiatry

## 2021-06-29 ENCOUNTER — Ambulatory Visit (INDEPENDENT_AMBULATORY_CARE_PROVIDER_SITE_OTHER): Payer: Commercial Managed Care - HMO

## 2021-06-29 DIAGNOSIS — B351 Tinea unguium: Secondary | ICD-10-CM | POA: Diagnosis not present

## 2021-06-29 DIAGNOSIS — M2041 Other hammer toe(s) (acquired), right foot: Secondary | ICD-10-CM

## 2021-06-29 DIAGNOSIS — L600 Ingrowing nail: Secondary | ICD-10-CM

## 2021-06-29 DIAGNOSIS — M2042 Other hammer toe(s) (acquired), left foot: Secondary | ICD-10-CM

## 2021-06-29 NOTE — Patient Instructions (Signed)
Place 1/4 cup of epsom salts in a quart of warm tap water.  Submerge your foot or feet in the solution and soak for 20 minutes.  This soak should be done twice a [place].  Next, remove your foot or feet from solution, blot dry the affected area. Apply ointment and cover if instructed by your doctor.   IF YOUR SKIN BECOMES IRRITATED WHILE USING THESE INSTRUCTIONS, IT IS OKAY TO SWITCH TO  WHITE VINEGAR AND WATER.  As another alternative soak, you may use antibacterial soap and water.  Monitor for any signs/symptoms of infection. Call the office immediately if any occur or go directly to the emergency room. Call with any questions/concerns.  

## 2021-06-29 NOTE — Progress Notes (Signed)
Patient Subjective:   Patient ID: Nancy Valenzuela, female   DOB: 46 y.o.   MRN: [id number]   HPI Patient presents stating she has been getting an ingrown toenail of chronic nature on her left big toe and it is hard for her to wear shoe gear with.  States that its been ongoing and has been difficult to trim herself and has tried to soak it along with other treatment patterns.  Patient has stopped smoking and tries to be active   Review of Systems  All other systems reviewed and are negative.      Objective:  Physical Exam Vitals and nursing note reviewed.  Constitutional:      Appearance: She is well-developed.  Pulmonary:     Effort: Pulmonary effort is normal.  Musculoskeletal:        General: Normal range of motion.  Skin:    General: Skin is warm.  Neurological:     Mental Status: She is alert.    Neurovascular status intact muscle strength was found to be adequate range of motion adequate.  Patient is found to have incurvated left hallux lateral border painful when pressed no redness no erythema no drainage noted painful when palpated ingrown     Assessment:  Toenail deformity left hallux with incurvation also noted to have yellow discoloration right hallux nail consistent with probable fungus      Plan:  H&P reviewed condition discussed and explained.  Today I went ahead and I did do treatment for both and for the right we will defer till the fall but at 1 point will probably require antifungal treatment and considerations for laser.  For the left I do recommend correction explained procedure risk the patient she wants this done I allowed her to read then signed consent form and I went ahead today and I infiltrated the left hallux 60 mg like Marcaine mixture sterile prep done using sterile instrumentation remove the lateral border exposed matrix applied phenol 3 applications 30 seconds followed by alcohol lavage sterile dressing gave instructions on soaks and leaving the dressing  on for 24 hours but taking it off earlier if any throbbing were to occur     Stating she has been getting

## 2021-07-12 ENCOUNTER — Other Ambulatory Visit: Payer: Self-pay | Admitting: Obstetrics and Gynecology

## 2022-02-20 DIAGNOSIS — Z01411 Encounter for gynecological examination (general) (routine) with abnormal findings: Secondary | ICD-10-CM | POA: Diagnosis not present

## 2022-02-20 DIAGNOSIS — Z1211 Encounter for screening for malignant neoplasm of colon: Secondary | ICD-10-CM | POA: Diagnosis not present

## 2022-02-20 DIAGNOSIS — K429 Umbilical hernia without obstruction or gangrene: Secondary | ICD-10-CM | POA: Diagnosis not present

## 2022-02-20 DIAGNOSIS — Z01419 Encounter for gynecological examination (general) (routine) without abnormal findings: Secondary | ICD-10-CM | POA: Diagnosis not present

## 2022-02-20 DIAGNOSIS — Z113 Encounter for screening for infections with a predominantly sexual mode of transmission: Secondary | ICD-10-CM | POA: Diagnosis not present

## 2022-02-20 DIAGNOSIS — Z1231 Encounter for screening mammogram for malignant neoplasm of breast: Secondary | ICD-10-CM | POA: Diagnosis not present

## 2022-02-20 DIAGNOSIS — Z6831 Body mass index (BMI) 31.0-31.9, adult: Secondary | ICD-10-CM | POA: Diagnosis not present

## 2022-03-17 DIAGNOSIS — K429 Umbilical hernia without obstruction or gangrene: Secondary | ICD-10-CM | POA: Diagnosis not present

## 2022-04-06 ENCOUNTER — Ambulatory Visit: Payer: Self-pay | Admitting: General Surgery

## 2022-04-07 DIAGNOSIS — E559 Vitamin D deficiency, unspecified: Secondary | ICD-10-CM | POA: Diagnosis not present

## 2022-04-07 DIAGNOSIS — E785 Hyperlipidemia, unspecified: Secondary | ICD-10-CM | POA: Diagnosis not present

## 2022-04-07 DIAGNOSIS — E7800 Pure hypercholesterolemia, unspecified: Secondary | ICD-10-CM | POA: Diagnosis not present

## 2022-04-07 DIAGNOSIS — R739 Hyperglycemia, unspecified: Secondary | ICD-10-CM | POA: Diagnosis not present

## 2022-04-07 DIAGNOSIS — R000 Tachycardia, unspecified: Secondary | ICD-10-CM | POA: Diagnosis not present

## 2022-04-07 DIAGNOSIS — K439 Ventral hernia without obstruction or gangrene: Secondary | ICD-10-CM | POA: Diagnosis not present

## 2022-04-07 DIAGNOSIS — I10 Essential (primary) hypertension: Secondary | ICD-10-CM | POA: Diagnosis not present

## 2022-04-15 NOTE — Patient Instructions (Signed)
SURGICAL WAITING ROOM VISITATION Patients having surgery or a procedure may have no more than 2 support people in the waiting area - these visitors may rotate in the visitor waiting room.   Due to an increase in RSV and influenza rates and associated hospitalizations, children ages 45 and under may not visit patients in [place]. If the patient needs to stay at the hospital during part of their recovery, the visitor guidelines for inpatient rooms apply.  PRE-OP VISITATION  Pre-op nurse will coordinate an appropriate time for 1 support person to accompany the patient in pre-op.  This support person may not rotate.  This visitor will be contacted when the time is appropriate for the visitor to come back in the pre-op area.  Please refer to the [facility] website for the visitor guidelines for Inpatients (after your surgery is over and you are in a regular room).  You are not required to quarantine at this time prior to your surgery. However, you must do this: Hand Hygiene often Do NOT share personal items Notify your provider if you are in close contact with someone who has COVID or you develop fever 100.4 or greater, new onset of sneezing, cough, sore throat, shortness of breath or body aches.  If you test positive for Covid or have been in contact with anyone that has tested positive in the last 10 days please notify you surgeon.    Your procedure is scheduled on:  Thursday  May 04, 2022  Report to [facility] Main Entrance: [place] entrance where the [employer] is available.   Report to admitting at: 05:15 AM  +++++Call this number if you have any questions or problems the morning of surgery [phone]  Do not eat food after Midnight the night prior to your surgery/procedure.  After Midnight you may have the following liquids until   04:30 AM [place] OF SURGERY  Clear Liquid Diet Water Black Coffee (sugar ok, NO MILK/CREAM OR CREAMERS)  Tea (sugar ok, NO  MILK/CREAM OR CREAMERS) regular and decaf                             Plain Jell-O  with no fruit (NO RED)                                           Fruit ices (not with fruit pulp, NO RED)                                     Popsicles (NO RED)                                                                  Juice: apple, WHITE grape, WHITE cranberry Sports drinks like Gatorade or Powerade (NO RED)                    The [place] of surgery:  Drink ONE (1) Pre-Surgery Clear Ensure at  04:30 AM the morning of surgery. Drink in one sitting. Do not  sip.  This drink was given to you during your hospital pre-op appointment visit. Nothing else to drink after completing the Pre-Surgery Clear Ensure: No candy, chewing gum or throat lozenges.    FOLLOW BOWEL PREP AND ANY ADDITIONAL PRE OP INSTRUCTIONS YOU RECEIVED FROM YOUR SURGEON'S OFFICE!!!   Oral Hygiene is also important to reduce your risk of infection.        Remember - BRUSH YOUR TEETH THE MORNING OF SURGERY WITH YOUR REGULAR TOOTHPASTE  Do NOT smoke after Midnight the night before surgery.  Take ONLY these medicines the morning of surgery with A SIP OF WATER: amlodipine, nebivolol, citalopram (Celexa). You may take Tylenol if needed for pain.                   You may not have any metal on your body including hair pins, jewelry, and body piercing  Do not wear make-up, lotions, powders, perfumes or deodorant  Do not wear nail polish including gel and S&S, artificial / acrylic nails, or any other type of covering on natural nails including finger and toenails. If you have artificial nails, gel coating, etc., that needs to be removed by a nail salon, Please have this removed prior to surgery. Not doing so may mean that your surgery could be cancelled or delayed if the Surgeon or anesthesia staff feels like they are unable to monitor you safely.   Do not shave 48 hours prior to surgery to avoid nicks in your skin which may contribute to  postoperative infections.   Patients discharged on the [place] of surgery will not be allowed to drive home.  Someone NEEDS to stay with you for the first 24 hours after anesthesia.  Do not bring your home medications to the hospital. The Pharmacy will dispense medications listed on your medication list to you during your admission in the Hospital.  Please read over the following fact sheets you were given: IF YOU HAVE QUESTIONS ABOUT YOUR [place], [place] [phone].   [place] - Preparing for Surgery Before surgery, you can play an important role.  Because skin is not sterile, your skin needs to be as free of germs as possible.  You can reduce the number of germs on your skin by washing with CHG (chlorahexidine gluconate) soap before surgery.  CHG is an antiseptic cleaner which kills germs and bonds with the skin to continue killing germs even after washing. Please DO NOT use if you have an allergy to CHG or antibacterial soaps.  If your skin becomes reddened/irritated stop using the CHG and inform your nurse when you arrive at Short Stay. Do not shave (including legs and underarms) for at least 48 hours prior to the first CHG shower.  You may shave your face/neck.  Please follow these instructions carefully:  1.  Shower with CHG Soap the night before surgery and the  morning of surgery.  2.  If you choose to wash your hair, wash your hair first as usual with your normal  shampoo.  3.  After you shampoo, rinse your hair and body thoroughly to remove the shampoo.                             4.  Use CHG as you would any other liquid soap.  You can apply chg directly to the skin and wash.  Gently with a scrungie or clean washcloth.  5.  Apply the CHG  Soap to your body ONLY FROM THE NECK DOWN.   Do not use on face/ open                           Wound or open sores. Avoid contact with eyes, ears mouth and genitals (private parts).                       Wash face,  Genitals  (private parts) with your normal soap.             6.  Wash thoroughly, paying special attention to the area where your  surgery  will be performed.  7.  Thoroughly rinse your body with warm water from the neck down.  8.  DO NOT shower/wash with your normal soap after using and rinsing off the CHG Soap.            9.  Pat yourself dry with a clean towel.            10.  Wear clean pajamas.            11.  Place clean sheets on your bed the night of your first shower and do not  sleep with pets.  ON THE [place] OF SURGERY : Do not apply any lotions/deodorants the morning of surgery.  Please wear clean clothes to the hospital/surgery center.    FAILURE TO FOLLOW THESE INSTRUCTIONS MAY RESULT IN THE CANCELLATION OF YOUR SURGERY  PATIENT SIGNATURE_________________________________  NURSE SIGNATURE__________________________________  ________________________________________________________________________

## 2022-04-15 NOTE — Progress Notes (Signed)
COVID Vaccine received:  []  No [x]  Yes Date of any COVID positive Test in last 90 days: none  PCP - Lucianne Lei, MD Cardiologist - none   Chest x-ray -  EKG - 04-27-2020 Epic   Will repeat at PST  Stress Test -  ECHO -  Cardiac Cath -   PCR screen: []  Ordered & Completed                      []   No Order but Needs PROFEND                      [x]   N/A for this surgery  Surgery Plan:  [x]  Ambulatory                            []  Outpatient in bed                            []  Admit  Anesthesia:    [x]  General  []  Spinal                           []   Choice []   MAC  Bowel Prep - []  No  []   Yes ______  Pacemaker / ICD device [x]  No []  Yes   Spinal Cord Stimulator:[]  No []  Yes     History of Sleep Apnea? [x]  No []  Yes   CPAP used?- [x]  No []  Yes    Does the patient monitor blood sugar? []  No []  Yes  [x]  N/A  Blood Thinner / Instructions: none Aspirin Instructions:  none  ERAS Protocol Ordered: []  No  [x]  Yes PRE-SURGERY [x]  ENSURE  []  G2  Patient is to be NPO after: 04:30 am  Comments: Patient had blood work done at the [facility] last week. They are faxing all these labs so we will not get any at PST appt.    Activity level: Patient is able to climb a flight of stairs without difficulty; [x]  No CP  [x]  No SOB.  Patient can perform ADLs without assistance.   Anesthesia review: HTN, Anemia, Anxiety. Some days smoker  Patient denies shortness of breath, fever, cough and chest pain at PAT appointment.  Patient verbalized understanding and agreement to the Pre-Surgical Instructions that were given to them at this PAT appointment. Patient was also educated of the need to review these PAT instructions again prior to her surgery.I reviewed the appropriate phone numbers to call if they have any and questions or concerns.

## 2022-04-18 ENCOUNTER — Encounter (HOSPITAL_COMMUNITY): Admission: RE | Admit: 2022-04-18 | Payer: PRIVATE HEALTH INSURANCE | Source: Ambulatory Visit

## 2022-04-19 ENCOUNTER — Encounter (HOSPITAL_COMMUNITY)
Admission: RE | Admit: 2022-04-19 | Discharge: 2022-04-19 | Disposition: A | Discharge status: Home / Self Care | Payer: 59 | Source: Ambulatory Visit | Attending: General Surgery | Admitting: General Surgery

## 2022-04-19 ENCOUNTER — Other Ambulatory Visit: Payer: Self-pay

## 2022-04-19 ENCOUNTER — Encounter (HOSPITAL_COMMUNITY): Payer: Self-pay

## 2022-04-19 VITALS — BP 138/85 | HR 77 | Temp 98.7°F | Resp 16 | Ht 62.0 in | Wt 182.0 lb

## 2022-04-19 DIAGNOSIS — I10 Essential (primary) hypertension: Secondary | ICD-10-CM

## 2022-04-19 DIAGNOSIS — Z01818 Encounter for other preprocedural examination: Secondary | ICD-10-CM | POA: Diagnosis not present

## 2022-04-19 HISTORY — DX: Nontoxic goiter, unspecified: E04.9

## 2022-04-20 ENCOUNTER — Other Ambulatory Visit (HOSPITAL_COMMUNITY): Payer: Self-pay

## 2022-04-21 ENCOUNTER — Other Ambulatory Visit (HOSPITAL_COMMUNITY): Payer: Self-pay

## 2022-04-21 MED ORDER — ERGOCALCIFEROL 1.25 MG (50000 UT) PO CAPS
50000.0000 [IU] | ORAL_CAPSULE | ORAL | 3 refills | Status: AC
  Filled 2022-04-21 – 2022-07-19 (×2): qty 12, 84d supply, fill #0
  Filled 2022-12-26: qty 12, 84d supply, fill #1
  Filled 2023-01-11: qty 12, 84d supply, fill #2

## 2022-04-21 MED ORDER — CLINDAMYCIN PHOSPHATE 1 % EX GEL
1.0000 | Freq: Every day | CUTANEOUS | 0 refills | Status: DC
  Filled 2022-04-21: qty 60, 60d supply, fill #0

## 2022-04-21 MED ORDER — SPIRONOLACTONE 50 MG PO TABS
50.0000 mg | ORAL_TABLET | Freq: Two times a day (BID) | ORAL | 5 refills | Status: AC
  Filled 2022-04-21: qty 60, 30d supply, fill #0

## 2022-04-21 MED ORDER — AMLODIPINE BESYLATE 10 MG PO TABS
10.0000 mg | ORAL_TABLET | Freq: Every day | ORAL | 1 refills | Status: DC
  Filled 2022-04-21 – 2022-07-19 (×2): qty 90, 90d supply, fill #0

## 2022-04-21 MED ORDER — NEBIVOLOL HCL 10 MG PO TABS
10.0000 mg | ORAL_TABLET | Freq: Every day | ORAL | 1 refills | Status: AC
  Filled 2022-04-21: qty 90, 90d supply, fill #0

## 2022-04-21 MED ORDER — CLINDAMYCIN PHOSPHATE 1 % EX GEL
1.0000 | Freq: Every day | CUTANEOUS | 0 refills | Status: DC
  Filled 2022-04-21: qty 60, 60d supply, fill #0
  Filled 2022-05-05: qty 60, 25d supply, fill #0

## 2022-04-21 MED ORDER — NEBIVOLOL HCL 10 MG PO TABS
10.0000 mg | ORAL_TABLET | Freq: Every day | ORAL | 1 refills | Status: DC
  Filled 2022-04-21: qty 90, 90d supply, fill #0

## 2022-04-26 ENCOUNTER — Other Ambulatory Visit (HOSPITAL_COMMUNITY): Payer: Self-pay

## 2022-05-04 ENCOUNTER — Ambulatory Visit (HOSPITAL_COMMUNITY): Payer: 59 | Admitting: Anesthesiology

## 2022-05-04 ENCOUNTER — Ambulatory Visit (HOSPITAL_COMMUNITY)
Admission: RE | Admit: 2022-05-04 | Discharge: 2022-05-04 | Disposition: A | Discharge status: Home / Self Care | Payer: 59 | Source: Ambulatory Visit | Attending: General Surgery | Admitting: General Surgery

## 2022-05-04 ENCOUNTER — Other Ambulatory Visit: Payer: Self-pay

## 2022-05-04 ENCOUNTER — Other Ambulatory Visit (HOSPITAL_COMMUNITY): Payer: Self-pay

## 2022-05-04 ENCOUNTER — Encounter (HOSPITAL_COMMUNITY): Payer: Self-pay | Admitting: General Surgery

## 2022-05-04 ENCOUNTER — Ambulatory Visit (HOSPITAL_BASED_OUTPATIENT_CLINIC_OR_DEPARTMENT_OTHER): Payer: 59 | Admitting: Anesthesiology

## 2022-05-04 ENCOUNTER — Encounter (HOSPITAL_COMMUNITY)
Admission: RE | Disposition: A | Discharge status: Home / Self Care | Payer: Self-pay | Source: Ambulatory Visit | Attending: General Surgery

## 2022-05-04 DIAGNOSIS — Z87891 Personal history of nicotine dependence: Secondary | ICD-10-CM | POA: Insufficient documentation

## 2022-05-04 DIAGNOSIS — Z01818 Encounter for other preprocedural examination: Secondary | ICD-10-CM

## 2022-05-04 DIAGNOSIS — E669 Obesity, unspecified: Secondary | ICD-10-CM | POA: Diagnosis not present

## 2022-05-04 DIAGNOSIS — K432 Incisional hernia without obstruction or gangrene: Secondary | ICD-10-CM

## 2022-05-04 DIAGNOSIS — F419 Anxiety disorder, unspecified: Secondary | ICD-10-CM | POA: Diagnosis not present

## 2022-05-04 DIAGNOSIS — F32A Depression, unspecified: Secondary | ICD-10-CM | POA: Insufficient documentation

## 2022-05-04 DIAGNOSIS — Z79899 Other long term (current) drug therapy: Secondary | ICD-10-CM | POA: Diagnosis not present

## 2022-05-04 DIAGNOSIS — I10 Essential (primary) hypertension: Secondary | ICD-10-CM | POA: Insufficient documentation

## 2022-05-04 HISTORY — PX: INCISIONAL HERNIA REPAIR: SHX193

## 2022-05-04 LAB — POCT PREGNANCY, URINE: Preg Test, Ur: NEGATIVE

## 2022-05-04 SURGERY — REPAIR, HERNIA, INCISIONAL
Anesthesia: General | Site: Abdomen

## 2022-05-04 MED ORDER — ROCURONIUM BROMIDE 10 MG/ML (PF) SYRINGE
PREFILLED_SYRINGE | INTRAVENOUS | Status: AC
  Filled 2022-05-04: qty 10

## 2022-05-04 MED ORDER — PHENYLEPHRINE 80 MCG/ML (10ML) SYRINGE FOR IV PUSH (FOR BLOOD PRESSURE SUPPORT)
PREFILLED_SYRINGE | INTRAVENOUS | Status: DC | PRN
  Administered 2022-05-04: 80 ug via INTRAVENOUS

## 2022-05-04 MED ORDER — CELECOXIB 200 MG PO CAPS
400.0000 mg | ORAL_CAPSULE | ORAL | Status: AC
  Administered 2022-05-04: 400 mg via ORAL
  Filled 2022-05-04: qty 2

## 2022-05-04 MED ORDER — OXYCODONE HCL 5 MG PO TABS
5.0000 mg | ORAL_TABLET | Freq: Four times a day (QID) | ORAL | 0 refills | Status: DC | PRN
Start: 2022-05-04 — End: 2022-05-04

## 2022-05-04 MED ORDER — EPHEDRINE SULFATE-NACL 50-0.9 MG/10ML-% IV SOSY
PREFILLED_SYRINGE | INTRAVENOUS | Status: DC | PRN
  Administered 2022-05-04 (×5): 5 mg via INTRAVENOUS

## 2022-05-04 MED ORDER — PROPOFOL 10 MG/ML IV BOLUS
INTRAVENOUS | Status: DC | PRN
  Administered 2022-05-04: 40 mg via INTRAVENOUS
  Administered 2022-05-04: 160 mg via INTRAVENOUS

## 2022-05-04 MED ORDER — SUGAMMADEX SODIUM 200 MG/2ML IV SOLN
INTRAVENOUS | Status: DC | PRN
  Administered 2022-05-04: 200 mg via INTRAVENOUS

## 2022-05-04 MED ORDER — ACETAMINOPHEN 500 MG PO TABS
1000.0000 mg | ORAL_TABLET | ORAL | Status: AC
  Administered 2022-05-04: 1000 mg via ORAL
  Filled 2022-05-04: qty 2

## 2022-05-04 MED ORDER — ROCURONIUM BROMIDE 10 MG/ML (PF) SYRINGE
PREFILLED_SYRINGE | INTRAVENOUS | Status: DC | PRN
  Administered 2022-05-04: 50 mg via INTRAVENOUS

## 2022-05-04 MED ORDER — PROPOFOL 10 MG/ML IV BOLUS
INTRAVENOUS | Status: AC
  Filled 2022-05-04: qty 20

## 2022-05-04 MED ORDER — MIDAZOLAM HCL 2 MG/2ML IJ SOLN
INTRAMUSCULAR | Status: AC
  Filled 2022-05-04: qty 2

## 2022-05-04 MED ORDER — CHLORHEXIDINE GLUCONATE CLOTH 2 % EX PADS: 6.0000 | MEDICATED_PAD | Freq: Once | CUTANEOUS | Status: DC

## 2022-05-04 MED ORDER — OXYCODONE HCL 5 MG PO TABS: 5.0000 mg | ORAL_TABLET | Freq: Once | ORAL | Status: AC | PRN

## 2022-05-04 MED ORDER — MIDAZOLAM HCL 5 MG/5ML IJ SOLN
INTRAMUSCULAR | Status: DC | PRN
  Administered 2022-05-04: 2 mg via INTRAVENOUS

## 2022-05-04 MED ORDER — LACTATED RINGERS IV SOLN: INTRAVENOUS | Status: DC

## 2022-05-04 MED ORDER — DEXAMETHASONE SODIUM PHOSPHATE 10 MG/ML IJ SOLN
INTRAMUSCULAR | Status: AC
  Filled 2022-05-04: qty 1

## 2022-05-04 MED ORDER — PROMETHAZINE HCL 25 MG/ML IJ SOLN
INTRAMUSCULAR | Status: AC
  Administered 2022-05-04: 6.25 mg via INTRAVENOUS
  Filled 2022-05-04: qty 1

## 2022-05-04 MED ORDER — BUPIVACAINE LIPOSOME 1.3 % IJ SUSP
INTRAMUSCULAR | Status: AC
  Filled 2022-05-04: qty 20

## 2022-05-04 MED ORDER — PHENYLEPHRINE 80 MCG/ML (10ML) SYRINGE FOR IV PUSH (FOR BLOOD PRESSURE SUPPORT)
PREFILLED_SYRINGE | INTRAVENOUS | Status: AC
  Filled 2022-05-04: qty 10

## 2022-05-04 MED ORDER — ORAL CARE MOUTH RINSE: 15.0000 mL | Freq: Once | OROMUCOSAL | Status: AC

## 2022-05-04 MED ORDER — ONDANSETRON HCL 4 MG/2ML IJ SOLN
INTRAMUSCULAR | Status: DC | PRN
  Administered 2022-05-04: 4 mg via INTRAVENOUS

## 2022-05-04 MED ORDER — OXYCODONE HCL 5 MG PO TABS
ORAL_TABLET | ORAL | Status: AC
  Administered 2022-05-04: 5 mg via ORAL
  Filled 2022-05-04: qty 1

## 2022-05-04 MED ORDER — FENTANYL CITRATE (PF) 250 MCG/5ML IJ SOLN
INTRAMUSCULAR | Status: DC | PRN
  Administered 2022-05-04: 25 ug via INTRAVENOUS
  Administered 2022-05-04: 75 ug via INTRAVENOUS
  Administered 2022-05-04 (×3): 50 ug via INTRAVENOUS

## 2022-05-04 MED ORDER — IBUPROFEN 800 MG PO TABS
800.0000 mg | ORAL_TABLET | Freq: Three times a day (TID) | ORAL | 0 refills | Status: DC | PRN
Start: 2022-05-04 — End: 2023-10-16
  Filled 2022-05-04: qty 30, 10d supply, fill #0

## 2022-05-04 MED ORDER — BUPIVACAINE HCL (PF) 0.25 % IJ SOLN
INTRAMUSCULAR | Status: AC
  Filled 2022-05-04: qty 30

## 2022-05-04 MED ORDER — LIDOCAINE HCL (PF) 2 % IJ SOLN
INTRAMUSCULAR | Status: AC
  Filled 2022-05-04: qty 5

## 2022-05-04 MED ORDER — FENTANYL CITRATE PF 50 MCG/ML IJ SOSY
PREFILLED_SYRINGE | INTRAMUSCULAR | Status: AC
  Administered 2022-05-04: 50 ug via INTRAVENOUS
  Filled 2022-05-04: qty 2

## 2022-05-04 MED ORDER — IBUPROFEN 800 MG PO TABS
800.0000 mg | ORAL_TABLET | Freq: Three times a day (TID) | ORAL | 0 refills | Status: DC | PRN
Start: 2022-05-04 — End: 2022-05-04

## 2022-05-04 MED ORDER — ONDANSETRON HCL 4 MG/2ML IJ SOLN
INTRAMUSCULAR | Status: AC
  Filled 2022-05-04: qty 2

## 2022-05-04 MED ORDER — 0.9 % SODIUM CHLORIDE (POUR BTL) OPTIME
TOPICAL | Status: DC | PRN
  Administered 2022-05-04: 1000 mL

## 2022-05-04 MED ORDER — BUPIVACAINE LIPOSOME 1.3 % IJ SUSP: 20.0000 mL | Freq: Once | INTRAMUSCULAR | Status: DC

## 2022-05-04 MED ORDER — PROMETHAZINE HCL 25 MG/ML IJ SOLN: 6.2500 mg | INTRAMUSCULAR | Status: DC | PRN

## 2022-05-04 MED ORDER — FENTANYL CITRATE PF 50 MCG/ML IJ SOSY: 25.0000 ug | PREFILLED_SYRINGE | INTRAMUSCULAR | Status: DC | PRN

## 2022-05-04 MED ORDER — DEXAMETHASONE SODIUM PHOSPHATE 10 MG/ML IJ SOLN
INTRAMUSCULAR | Status: DC | PRN
  Administered 2022-05-04: 10 mg via INTRAVENOUS

## 2022-05-04 MED ORDER — FENTANYL CITRATE (PF) 250 MCG/5ML IJ SOLN
INTRAMUSCULAR | Status: AC
  Filled 2022-05-04: qty 5

## 2022-05-04 MED ORDER — LIDOCAINE 2% (20 MG/ML) 5 ML SYRINGE
INTRAMUSCULAR | Status: DC | PRN
  Administered 2022-05-04: 80 mg via INTRAVENOUS

## 2022-05-04 MED ORDER — ENSURE PRE-SURGERY PO LIQD: 296.0000 mL | Freq: Once | ORAL | Status: DC

## 2022-05-04 MED ORDER — BUPIVACAINE LIPOSOME 1.3 % IJ SUSP
INTRAMUSCULAR | Status: DC | PRN
  Administered 2022-05-04: 50 mL

## 2022-05-04 MED ORDER — CHLORHEXIDINE GLUCONATE 0.12 % MT SOLN
15.0000 mL | Freq: Once | OROMUCOSAL | Status: AC
  Administered 2022-05-04: 15 mL via OROMUCOSAL

## 2022-05-04 MED ORDER — CEFAZOLIN SODIUM-DEXTROSE 2-4 GM/100ML-% IV SOLN
2.0000 g | INTRAVENOUS | Status: AC
  Administered 2022-05-04: 2 g via INTRAVENOUS
  Filled 2022-05-04: qty 100

## 2022-05-04 MED ORDER — OXYCODONE HCL 5 MG PO TABS
5.0000 mg | ORAL_TABLET | Freq: Four times a day (QID) | ORAL | 0 refills | Status: DC | PRN
  Filled 2022-05-04: qty 20, 5d supply, fill #0

## 2022-05-04 SURGICAL SUPPLY — 43 items
ADH SKN CLS APL DERMABOND .7 (GAUZE/BANDAGES/DRESSINGS) ×1
APL PRP STRL LF DISP 70% ISPRP (MISCELLANEOUS) ×1
APL SKNCLS STERI-STRIP NONHPOA (GAUZE/BANDAGES/DRESSINGS)
BAG COUNTER SPONGE SURGICOUNT (BAG) IMPLANT
BAG SPNG CNTER NS LX DISP (BAG)
BENZOIN TINCTURE PRP APPL 2/3 (GAUZE/BANDAGES/DRESSINGS) IMPLANT
CHLORAPREP W/TINT 26 (MISCELLANEOUS) ×1 IMPLANT
COVER SURGICAL LIGHT HANDLE (MISCELLANEOUS) ×1 IMPLANT
DERMABOND ADVANCED .7 DNX12 (GAUZE/BANDAGES/DRESSINGS) IMPLANT
DRAIN CHANNEL 19F RND (DRAIN) IMPLANT
DRAPE LAPAROSCOPIC ABDOMINAL (DRAPES) ×1 IMPLANT
DRSG TELFA 4X10 ISLAND STR (GAUZE/BANDAGES/DRESSINGS) IMPLANT
DRSG TELFA 4X8 ISLAND (GAUZE/BANDAGES/DRESSINGS) IMPLANT
ELECT REM PT RETURN 15FT ADLT (MISCELLANEOUS) ×1 IMPLANT
EVACUATOR SILICONE 100CC (DRAIN) IMPLANT
GLOVE BIOGEL PI IND STRL 7.0 (GLOVE) ×1 IMPLANT
GLOVE SURG SS PI 7.0 STRL IVOR (GLOVE) ×1 IMPLANT
GOWN STRL REUS W/ TWL LRG LVL3 (GOWN DISPOSABLE) ×1 IMPLANT
GOWN STRL REUS W/ TWL XL LVL3 (GOWN DISPOSABLE) IMPLANT
GOWN STRL REUS W/TWL LRG LVL3 (GOWN DISPOSABLE) ×1
GOWN STRL REUS W/TWL XL LVL3 (GOWN DISPOSABLE)
KIT BASIN OR (CUSTOM PROCEDURE TRAY) ×1 IMPLANT
KIT TURNOVER KIT A (KITS) IMPLANT
MESH BARD SOFT 6X6IN (Mesh General) IMPLANT
NDL HYPO 22X1.5 SAFETY MO (MISCELLANEOUS) ×1 IMPLANT
NEEDLE HYPO 22X1.5 SAFETY MO (MISCELLANEOUS) ×1 IMPLANT
PACK GENERAL/GYN (CUSTOM PROCEDURE TRAY) ×1 IMPLANT
SPIKE FLUID TRANSFER (MISCELLANEOUS) ×1 IMPLANT
SPONGE DRAIN TRACH 4X4 STRL 2S (GAUZE/BANDAGES/DRESSINGS) IMPLANT
STRIP CLOSURE SKIN 1/2X4 (GAUZE/BANDAGES/DRESSINGS) IMPLANT
SUT ETHILON 2 0 PS N (SUTURE) IMPLANT
SUT MNCRL AB 4-0 PS2 18 (SUTURE) ×1 IMPLANT
SUT NOVA 0 T19/GS 22DT (SUTURE) IMPLANT
SUT NOVA NAB GS-21 0 18 T12 DT (SUTURE) IMPLANT
SUT PDS AB 0 CT1 36 (SUTURE) ×2 IMPLANT
SUT VIC AB 2-0 CT1 27 (SUTURE)
SUT VIC AB 2-0 CT1 TAPERPNT 27 (SUTURE) IMPLANT
SUT VIC AB 3-0 SH 18 (SUTURE) ×1 IMPLANT
SUT VIC AB 3-0 SH 27 (SUTURE)
SUT VIC AB 3-0 SH 27XBRD (SUTURE) IMPLANT
SYR 20ML LL LF (SYRINGE) ×1 IMPLANT
TOWEL OR 17X26 10 PK STRL BLUE (TOWEL DISPOSABLE) ×1 IMPLANT
TOWEL OR NON WOVEN STRL DISP B (DISPOSABLE) ×1 IMPLANT

## 2022-05-04 NOTE — Anesthesia Preprocedure Evaluation (Addendum)
Anesthesia Evaluation  Patient identified by MRN, date of birth, ID band Patient awake    Reviewed: Allergy & Precautions, NPO status , Patient's Chart, lab work & pertinent test results, reviewed documented beta blocker date and time   Airway Mallampati: III  TM Distance: >3 FB Neck ROM: Full    Dental  (+) Teeth Intact, Dental Advisory Given, Caps,    Pulmonary Current Smoker, former smoker   Pulmonary exam normal breath sounds clear to auscultation       Cardiovascular hypertension, Pt. on medications and Pt. on home beta blockers Normal cardiovascular exam Rhythm:Regular Rate:Normal     Neuro/Psych  PSYCHIATRIC DISORDERS Anxiety Depression    negative neurological ROS     GI/Hepatic Neg liver ROS,,, INCISIONAL HERNIA   Endo/Other  Obesity   Renal/GU negative Renal ROS     Musculoskeletal negative musculoskeletal ROS (+)    Abdominal   Peds  Hematology negative hematology ROS (+)   Anesthesia Other Findings [place] of surgery medications reviewed with the patient.  Reproductive/Obstetrics negative OB ROS                              Anesthesia Physical Anesthesia Plan  ASA: 2  Anesthesia Plan: General   Post-op Pain Management: Tylenol PO (pre-op)* and Celebrex PO (pre-op)*   Induction: Intravenous  PONV Risk Score and Plan: 2 and Midazolam, Dexamethasone and Ondansetron  Airway Management Planned: Oral ETT  Additional Equipment:   Intra-op Plan:   Post-operative Plan: Extubation in OR  Informed Consent: I have reviewed the patients History and Physical, chart, labs and discussed the procedure including the risks, benefits and alternatives for the proposed anesthesia with the patient or authorized representative who has indicated his/her understanding and acceptance.     Dental advisory given  Plan Discussed with: CRNA  Anesthesia Plan Comments:          Anesthesia Quick Evaluation

## 2022-05-04 NOTE — Anesthesia Procedure Notes (Addendum)
Procedure Name: Intubation Date/Time: 05/04/2022 7:43 AM  Performed by: Collene Schlichter, MDPre-anesthesia Checklist: Patient identified, Emergency Drugs available, Suction available and Patient being monitored Patient Re-evaluated:Patient Re-evaluated prior to induction Oxygen Delivery Method: Circle system utilized Preoxygenation: Pre-oxygenation with 100% oxygen Induction Type: IV induction Ventilation: Mask ventilation with difficulty and Two handed mask ventilation required Laryngoscope Size: Glidescope and 4 Grade View: Grade II Tube type: Oral Tube size: 7.0 mm Number of attempts: 3 Airway Equipment and Method: Video-laryngoscopy, Stylet and Rigid stylet Placement Confirmation: ETT inserted through vocal cords under direct vision, positive ETCO2 and breath sounds checked- equal and bilateral Secured at: 21 cm Tube secured with: Tape Dental Injury: Teeth and Oropharynx as per pre-operative assessment and Injury to tongue  Difficulty Due To: Difficulty was anticipated Future Recommendations: Recommend- induction with short-acting agent, and alternative techniques readily available Comments: Attempt x1 by CRNA with Hyacinth Meeker 2, grade 4 view. Attempt by MDA x1 with MAC 4, also grade 4 view. Redundant tissue around glottis. 3rd attempt by MDA with Glidescope 4 blade with visualization of VC, ETT passed easily. Tip of tongue injury noted from mask ventilation.

## 2022-05-04 NOTE — Transfer of Care (Signed)
Immediate Anesthesia Transfer of Care Note  Patient: Nancy Valenzuela  Procedure(s) Performed: OPEN INCISIONAL HERNIA REPAIR WITH MESH (Abdomen)  Patient Location: PACU  Anesthesia Type:General  Level of Consciousness: awake, oriented, and patient cooperative  Airway & Oxygen Therapy: Patient Spontanous Breathing and Patient connected to face mask oxygen  Post-op Assessment: Report given to RN and Post -op Vital signs reviewed and stable  Post vital signs: Reviewed  Last Vitals:  Vitals Value Taken Time  BP 132/76 05/04/22 0929  Temp    Pulse 80 05/04/22 0930  Resp 11 05/04/22 0930  SpO2 100 % 05/04/22 0930  Vitals shown include unvalidated device data.  Last Pain:  Vitals:   05/04/22 0546  TempSrc: Oral         Complications:  Encounter Notable Events  Notable Event Outcome Phase Comment  Difficult to intubate - expected  Intraprocedure Filed from anesthesia note documentation.

## 2022-05-04 NOTE — H&P (Signed)
Chief Complaint: New Consultation (Umbilical Hernia - c/o discomfort after eating and movement off and on )  History of Present Illness: Nancy Valenzuela is a 47 y.o. female who is seen today as an office consultation for evaluation of New Consultation (Umbilical Hernia - c/o discomfort after eating and movement off and on )  She had a robotic myomectomy 2 years ago. In the last few months she has developed a bulge at the midline incision. She has noticed some pain with movement and constipation. She denies nausea or vomiting.  Review of Systems: A complete review of systems was obtained from the patient. I have reviewed this information and discussed as appropriate with the patient. See HPI as well for other ROS.  Review of Systems  Constitutional: Negative.  HENT: Negative.  Eyes: Negative.  Respiratory: Negative.  Cardiovascular: Negative.  Gastrointestinal: Positive for abdominal pain.  Genitourinary: Negative.  Musculoskeletal: Negative.  Skin: Negative.  Neurological: Negative.  Endo/Heme/Allergies: Negative.  Psychiatric/Behavioral: Negative.    Medical History: Past Medical History:  Diagnosis Date  Anemia  Anxiety  Arthritis  Hypertension   Patient Active Problem List  Diagnosis  Cervical lymphadenopathy  Essential hypertension, benign  Fibroid, uterine  Herpes simplex  Menorrhagia  Multinodular goiter  Screen for STD (sexually transmitted disease)  Smoking   Past Surgical History:  Procedure Laterality Date  Pilonidal Cyst Surgery 2001  MYOMECTOMY ABDOMINAL 03/2020  CESAREAN SECTION  2000, 2004    No Known Allergies  Current Outpatient Medications on File Prior to Visit  Medication Sig Dispense Refill  ACCRUFER 30 mg Cap Take 1 capsule by mouth 2 (two) times daily  amLODIPine (NORVASC) 10 MG tablet Take 1 tablet by mouth once daily  biotin 5 mg capsule Take by mouth once daily  citalopram 30 mg Cap Take 1 capsule by mouth once daily  magnesium 200 mg  Take by mouth  spironolactone (ALDACTONE) 50 MG tablet Take 1 tablet by mouth 2 (two) times daily   No current facility-administered medications on file prior to visit.   History reviewed. No pertinent family history.   Social History   Tobacco Use  Smoking Status Former  Types: Cigarettes  Quit date: 01/2022  Years since quitting: 0.1  Smokeless Tobacco Never    Social History   Socioeconomic History  Marital status: Single  Tobacco Use  Smoking status: Former  Types: Cigarettes  Quit date: 01/2022  Years since quitting: 0.1  Smokeless tobacco: Never  Substance and Sexual Activity  Alcohol use: Yes  Drug use: Never   Objective:   Vitals:  03/17/22 1517  BP: 126/80  Pulse: 75  Temp: 36.7 C (98.1 F)  SpO2: 96%  Weight: 82.1 kg (181 lb)  Height: 160 cm (5\' 3" )  PainSc: 1   Body mass index is 32.06 kg/m.  Physical Exam Constitutional:  Appearance: Normal appearance.  HENT:  Head: Normocephalic and atraumatic.  Pulmonary:  Effort: Pulmonary effort is normal.  Abdominal:  Comments: Supraumbilical scar with underlying hernia, defect 1 cm  Musculoskeletal:  General: Normal range of motion.  Cervical back: Normal range of motion.  Neurological:  General: No focal deficit present.  Mental Status: She is alert and oriented to person, place, and time. Mental status is at baseline.  Psychiatric:  Mood and Affect: Mood normal.  Behavior: Behavior normal.  Thought Content: Thought content normal.     Labs, Imaging and Diagnostic Testing: I reviewed notes by [name]  Assessment and Plan:   Diagnoses and all orders  for this visit:  Umbilical hernia without obstruction or gangrene   The patient has a symptomatic reducible hernia. She recently quit smoking. We discussed the etiology of his hernia, the risk of it enlarging, incarceration, obstruction, strangulation, and that it is unlikely to get smaller or better on its own. We discussed operative  options of laparoscopic vs open repair with mesh including the risks of recurrence, injury to intestines or abdominal organs, or chronic pain associated with mesh. We decided to proceed with open incisional hernia repair with preperitoneal mesh as outpatient.

## 2022-05-04 NOTE — Discharge Instructions (Signed)
[facility] _______[facility]  UMBILICAL OR INGUINAL HERNIA REPAIR: POST OP INSTRUCTIONS  Always review your discharge instruction sheet given to you by the facility where your surgery was performed. IF YOU HAVE DISABILITY OR FAMILY LEAVE FORMS, YOU MUST BRING THEM TO THE OFFICE FOR PROCESSING.   DO NOT GIVE THEM TO YOUR DOCTOR.  1. A  prescription for pain medication may be given to you upon discharge.  Take your pain medication as prescribed, if needed.  If narcotic pain medicine is not needed, then you may take acetaminophen (Tylenol) or ibuprofen (Advil) as needed. 2. Take your usually prescribed medications unless otherwise directed. If you need a refill on your pain medication, please contact your pharmacy.  They will contact our office to request authorization. Prescriptions will not be filled after 5 pm or on week-ends. 3. You should follow a light diet the first 24 hours after arrival home, such as soup and crackers, etc.  Be sure to include lots of fluids daily.  Resume your normal diet the [place] after surgery. 4.Most patients will experience some swelling and bruising around the umbilicus or in the groin and scrotum.  Ice packs and reclining will help.  Swelling and bruising can take several days to resolve.  6. It is common to experience some constipation if taking pain medication after surgery.  Increasing fluid intake and taking a stool softener (such as Colace) will usually help or prevent this problem from occurring.  A mild laxative (Milk of Magnesia or Miralax) should be taken according to package directions if there are no bowel movements after 48 hours. 7. Unless discharge instructions indicate otherwise, you may remove your bandages 24-48 hours after surgery, and you may shower at that time.  You may have steri-strips (small skin tapes) in place directly over the incision.  These strips should be left on the skin for 7-10 days.  If your surgeon used skin glue on the  incision, you may shower in 24 hours.  The glue will flake off over the next 2-3 weeks.  Any sutures or staples will be removed at the office during your follow-up visit. 8. ACTIVITIES:  You may resume regular (light) daily activities beginning the next [place]--such as daily self-care, walking, climbing stairs--gradually increasing activities as tolerated.  You may have sexual intercourse when it is comfortable.  Refrain from any heavy lifting or straining until approved by your doctor.  a.You may drive when you are no longer taking prescription pain medication, you can comfortably wear a seatbelt, and you can safely maneuver your car and apply brakes. b.RETURN TO WORK:   _____________________________________________  9.You should see your doctor in the office for a follow-up appointment approximately 2-3 weeks after your surgery.  Make sure that you call for this appointment within a [place] or two after you arrive home to insure a convenient appointment time. 10.OTHER INSTRUCTIONS: _________________________    _____________________________________  WHEN TO CALL YOUR DOCTOR: Fever over 101.0 Inability to urinate Nausea and/or vomiting Extreme swelling or bruising Continued bleeding from incision. Increased pain, redness, or drainage from the incision  The clinic staff is available to answer your questions during regular business hours.  Please don't hesitate to call and ask to speak to one of the nurses for clinical concerns.  If you have a medical emergency, go to the nearest emergency room or call 911.  A surgeon from [facility] is always on call at the hospital   [address], [address],   [place], [place]  [address] ?  [address] [address], [place], [place]   [address] [phone] ? [phone] ? FAX [phone] Web site: [email or web address]  

## 2022-05-04 NOTE — Op Note (Signed)
PATIENT:  Nancy Valenzuela  47 y.o. female  PRE-OPERATIVE DIAGNOSIS:  INCISIONAL HERNIA  POST-OPERATIVE DIAGNOSIS:  INCISIONAL HERNIA  PROCEDURE:  Procedure(s): OPEN INCISIONAL HERNIA REPAIR WITH MESH   SURGEON:  Surgeon(s): Bethanne Mule, [place], MD  ASSISTANT: none   ANESTHESIA:   local and general  Indications for procedure: Nancy Valenzuela is a 47 y.o. year old female with symptoms of enlarging incisional hernia with occasional pain.  Description of procedure: The patient was brought into the operative suite. Anesthesia was administered with General endotracheal anesthesia. WHO checklist was applied. The patient was then placed in supine. The area was prepped and draped in the usual sterile fashion.  Next the supraumbilical skin was anesthetized with Marcaine/Exparel mix. A vertical incision was made. Cautery and blunt dissection was used to dissect down to the fascia. The hernia sac was dissected free from surrounding tissues in 360 degrees. The hernia sac was reduced and contained no visceral structures.  The hernia defect was 2 cm in diameter. The hernia sac was removed. The preperitoneal space was dissected. On the left side the peritoneum was torn and repaired with 3-0 vicryl. A portion of the posterior rectus was freed due to scarring of the peritoneum to allow an extraperitoneal mesh placement. Due to the size of the hernia, a 10 x 10 cm Bard mesh was inserted into the preperitoneal space. Care was taken to lay the mesh flat. The mesh was sutured to the fascia with 4 0 novafil. The fascial defect was then primarily closed with interrupted 0 PDS sutures. The skin edges were cut back to remove scar. The deep dermal space was closed with a 3-0 vicryl. Marcaine/Exparel mix was injected into the muscle layer and around the fascia. The skin was closed with a 4-0 monocryl subcuticular suture. Dermabond was put in place for dressing. The patient awoke from anesthesia and was brought to pacu in stable  condition. All counts were correct.  Findings: 2 cm incisional hernia  Specimen: none  Blood loss: 30 ml  Local anesthesia: 50 ml Marcaine/Exparel mix  Complications: none  PLAN OF CARE: Discharge to home after PACU  PATIENT DISPOSITION:  PACU - hemodynamically stable.  Feliciana Rossetti, M.D. General, Bariatric, & Minimally Invasive Surgery  [facility] [place]  05/04/2022 9:32 AM

## 2022-05-05 ENCOUNTER — Encounter (HOSPITAL_COMMUNITY): Payer: Self-pay | Admitting: General Surgery

## 2022-05-05 ENCOUNTER — Other Ambulatory Visit: Payer: Self-pay

## 2022-05-05 ENCOUNTER — Other Ambulatory Visit (HOSPITAL_COMMUNITY): Payer: Self-pay

## 2022-05-05 MED ORDER — CITALOPRAM HYDROBROMIDE 30 MG PO CAPS
30.0000 mg | ORAL_CAPSULE | Freq: Every day | ORAL | 0 refills | Status: AC
  Filled 2022-05-05: qty 90, 90d supply, fill #0

## 2022-05-05 NOTE — Anesthesia Postprocedure Evaluation (Signed)
Anesthesia Post Note  Patient: Rasheda Varin  Procedure(s) Performed: OPEN INCISIONAL HERNIA REPAIR WITH MESH (Abdomen)     Patient location during evaluation: PACU Anesthesia Type: General Level of consciousness: awake and alert Pain management: pain level controlled Vital Signs Assessment: post-procedure vital signs reviewed and stable Respiratory status: spontaneous breathing, nonlabored ventilation, respiratory function stable and patient connected to nasal cannula oxygen Cardiovascular status: blood pressure returned to baseline and stable Postop Assessment: no apparent nausea or vomiting Anesthetic complications: yes   Encounter Notable Events  Notable Event Outcome Phase Comment  Difficult to intubate - expected  Intraprocedure Filed from anesthesia note documentation.    Last Vitals:  Vitals:   05/04/22 1043 05/04/22 1051  BP: 121/73 133/67  Pulse: 80 71  Resp: 13   Temp:    SpO2: 94% 97%    Last Pain:  Vitals:   05/04/22 1051  TempSrc:   PainSc: 0-No pain                 Collene Schlichter

## 2022-05-08 ENCOUNTER — Other Ambulatory Visit (HOSPITAL_COMMUNITY): Payer: Self-pay

## 2022-05-08 MED ORDER — CITALOPRAM HYDROBROMIDE 10 MG PO TABS
30.0000 mg | ORAL_TABLET | Freq: Every day | ORAL | 1 refills | Status: AC
  Filled 2022-05-08: qty 270, 90d supply, fill #0
  Filled 2022-09-18: qty 270, 90d supply, fill #1

## 2022-05-19 ENCOUNTER — Other Ambulatory Visit (HOSPITAL_COMMUNITY): Payer: Self-pay

## 2022-05-19 DIAGNOSIS — T7849XA Other allergy, initial encounter: Secondary | ICD-10-CM | POA: Diagnosis not present

## 2022-05-19 DIAGNOSIS — J301 Allergic rhinitis due to pollen: Secondary | ICD-10-CM | POA: Diagnosis not present

## 2022-05-19 MED ORDER — FAMOTIDINE 10 MG PO TABS
10.0000 mg | ORAL_TABLET | Freq: Three times a day (TID) | ORAL | 1 refills | Status: AC
  Filled 2022-07-19: qty 90, 30d supply, fill #0

## 2022-05-19 MED ORDER — FEXOFENADINE HCL 180 MG PO TABS
180.0000 mg | ORAL_TABLET | Freq: Every day | ORAL | 2 refills | Status: AC
  Filled 2022-07-19: qty 30, 30d supply, fill #0

## 2022-05-23 ENCOUNTER — Other Ambulatory Visit (HOSPITAL_COMMUNITY): Payer: Self-pay

## 2022-05-23 MED ORDER — SPIRONOLACTONE 50 MG PO TABS
50.0000 mg | ORAL_TABLET | Freq: Two times a day (BID) | ORAL | 5 refills | Status: DC
  Filled 2022-05-23: qty 60, 15d supply, fill #0

## 2022-05-23 MED ORDER — TRIAMCINOLONE ACETONIDE 0.1 % EX CREA
TOPICAL_CREAM | Freq: Two times a day (BID) | CUTANEOUS | 2 refills | Status: AC
  Filled 2022-05-23: qty 80, 15d supply, fill #0
  Filled 2022-07-19: qty 80, 15d supply, fill #1
  Filled 2023-03-10 – 2023-03-22 (×2): qty 80, 15d supply, fill #2

## 2022-05-23 MED ORDER — CLINDAMYCIN PHOSPHATE 1 % EX GEL
1.0000 | Freq: Two times a day (BID) | CUTANEOUS | 3 refills | Status: AC
  Filled 2022-05-23 – 2022-07-19 (×2): qty 60, 30d supply, fill #0

## 2022-05-23 MED ORDER — SPIRONOLACTONE 50 MG PO TABS
50.0000 mg | ORAL_TABLET | Freq: Every day | ORAL | 5 refills | Status: AC
  Filled 2022-05-23: qty 60, 30d supply, fill #0
  Filled 2022-07-19: qty 60, 30d supply, fill #1

## 2022-05-25 DIAGNOSIS — K429 Umbilical hernia without obstruction or gangrene: Secondary | ICD-10-CM | POA: Diagnosis not present

## 2022-05-26 ENCOUNTER — Encounter: Payer: Self-pay | Admitting: Podiatry

## 2022-05-26 ENCOUNTER — Other Ambulatory Visit (HOSPITAL_COMMUNITY): Payer: Self-pay

## 2022-05-26 ENCOUNTER — Ambulatory Visit: Payer: 59 | Admitting: Podiatry

## 2022-05-26 DIAGNOSIS — L600 Ingrowing nail: Secondary | ICD-10-CM

## 2022-05-26 MED ORDER — TERBINAFINE HCL 250 MG PO TABS
ORAL_TABLET | ORAL | 0 refills | Status: AC
  Filled 2022-05-26: qty 28, 112d supply, fill #0

## 2022-05-26 NOTE — Patient Instructions (Signed)
Place 1/4 cup of epsom salts in a quart of warm tap water.  Submerge your foot or feet in the solution and soak for 20 minutes.  This soak should be done twice a [place].  Next, remove your foot or feet from solution, blot dry the affected area. Apply ointment and cover if instructed by your doctor.   IF YOUR SKIN BECOMES IRRITATED WHILE USING THESE INSTRUCTIONS, IT IS OKAY TO SWITCH TO  WHITE VINEGAR AND WATER.  As another alternative soak, you may use antibacterial soap and water.  Monitor for any signs/symptoms of infection. Call the office immediately if any occur or go directly to the emergency room. Call with any questions/concerns.  

## 2022-05-29 NOTE — Progress Notes (Signed)
Subjective:   Patient ID: Nancy Valenzuela, female   DOB: 47 y.o.   MRN: [id number]   HPI Patient presents stating that she has a painful ingrown toenail big toe left foot and at the corner has been incurvated and tender.  States she has tried to trim and soak it without relief   ROS      Objective:  Physical Exam  Neurovascular status intact incurvated medial border left hallux pain slight redness no active drainage     Assessment:  Ingrown toenail deformity left hallux medial border with pain     Plan:  H&P reviewed recommended correction of deformity and explained procedure risk allowing her to sign consent form.  Today I infiltrated the left hallux 60 mg Xylocaine Marcaine mixture sterile prep done and using sterile instrumentation remove the border exposed matrix applied phenol 3 applications 30 seconds followed by alcohol lavage sterile dressing gave instructions on soaks and wearing a dressing for 24 hours and taking it off earlier if throbbing should occur.  Patient will be seen back to recheck as needed and encouraged to call with questions

## 2022-06-12 ENCOUNTER — Other Ambulatory Visit (HOSPITAL_COMMUNITY): Payer: Self-pay

## 2022-07-19 ENCOUNTER — Other Ambulatory Visit (HOSPITAL_COMMUNITY): Payer: Self-pay

## 2022-07-19 ENCOUNTER — Other Ambulatory Visit: Payer: Self-pay

## 2022-07-20 ENCOUNTER — Other Ambulatory Visit (HOSPITAL_COMMUNITY): Payer: Self-pay

## 2022-07-21 DIAGNOSIS — K621 Rectal polyp: Secondary | ICD-10-CM | POA: Diagnosis not present

## 2022-07-21 DIAGNOSIS — K648 Other hemorrhoids: Secondary | ICD-10-CM | POA: Diagnosis not present

## 2022-07-21 DIAGNOSIS — K6389 Other specified diseases of intestine: Secondary | ICD-10-CM | POA: Diagnosis not present

## 2022-07-21 DIAGNOSIS — D127 Benign neoplasm of rectosigmoid junction: Secondary | ICD-10-CM | POA: Diagnosis not present

## 2022-07-21 DIAGNOSIS — Z1211 Encounter for screening for malignant neoplasm of colon: Secondary | ICD-10-CM | POA: Diagnosis not present

## 2022-07-21 DIAGNOSIS — K635 Polyp of colon: Secondary | ICD-10-CM | POA: Diagnosis not present

## 2022-07-21 DIAGNOSIS — D128 Benign neoplasm of rectum: Secondary | ICD-10-CM | POA: Diagnosis not present

## 2022-08-02 DIAGNOSIS — R0683 Snoring: Secondary | ICD-10-CM | POA: Diagnosis not present

## 2022-08-02 DIAGNOSIS — E559 Vitamin D deficiency, unspecified: Secondary | ICD-10-CM | POA: Diagnosis not present

## 2022-08-02 DIAGNOSIS — F331 Major depressive disorder, recurrent, moderate: Secondary | ICD-10-CM | POA: Diagnosis not present

## 2022-08-02 DIAGNOSIS — E785 Hyperlipidemia, unspecified: Secondary | ICD-10-CM | POA: Diagnosis not present

## 2022-08-02 DIAGNOSIS — R635 Abnormal weight gain: Secondary | ICD-10-CM | POA: Diagnosis not present

## 2022-08-02 DIAGNOSIS — Z6833 Body mass index (BMI) 33.0-33.9, adult: Secondary | ICD-10-CM | POA: Diagnosis not present

## 2022-08-02 DIAGNOSIS — R739 Hyperglycemia, unspecified: Secondary | ICD-10-CM | POA: Diagnosis not present

## 2022-08-02 DIAGNOSIS — I10 Essential (primary) hypertension: Secondary | ICD-10-CM | POA: Diagnosis not present

## 2022-08-04 DIAGNOSIS — M65312 Trigger thumb, left thumb: Secondary | ICD-10-CM | POA: Diagnosis not present

## 2022-08-09 ENCOUNTER — Other Ambulatory Visit (HOSPITAL_COMMUNITY): Payer: Self-pay

## 2022-08-09 MED ORDER — SEMAGLUTIDE-WEIGHT MANAGEMENT 0.5 MG/0.5ML ~~LOC~~ SOAJ
0.5000 mg | SUBCUTANEOUS | 0 refills | Status: AC
  Filled 2022-08-09 – 2022-08-28 (×2): qty 2, 28d supply, fill #0
  Filled 2023-06-25: qty 2, fill #0
  Filled 2023-07-20 – 2023-08-02 (×2): qty 2, 28d supply, fill #0

## 2022-08-10 ENCOUNTER — Other Ambulatory Visit (HOSPITAL_COMMUNITY): Payer: Self-pay

## 2022-08-28 ENCOUNTER — Other Ambulatory Visit (HOSPITAL_COMMUNITY): Payer: Self-pay

## 2022-09-04 ENCOUNTER — Other Ambulatory Visit (HOSPITAL_COMMUNITY): Payer: Self-pay

## 2022-09-04 MED ORDER — SEMAGLUTIDE-WEIGHT MANAGEMENT 0.5 MG/0.5ML ~~LOC~~ SOAJ
0.5000 mg | SUBCUTANEOUS | 0 refills | Status: AC
  Filled 2022-09-04: qty 2, 28d supply, fill #0

## 2022-09-12 ENCOUNTER — Other Ambulatory Visit (HOSPITAL_COMMUNITY): Payer: Self-pay

## 2022-09-14 ENCOUNTER — Other Ambulatory Visit (HOSPITAL_COMMUNITY): Payer: Self-pay

## 2022-09-18 ENCOUNTER — Other Ambulatory Visit (HOSPITAL_COMMUNITY): Payer: Self-pay

## 2022-09-19 ENCOUNTER — Other Ambulatory Visit (HOSPITAL_COMMUNITY): Payer: Self-pay

## 2022-09-20 ENCOUNTER — Other Ambulatory Visit (HOSPITAL_COMMUNITY): Payer: Self-pay

## 2022-09-27 ENCOUNTER — Other Ambulatory Visit (HOSPITAL_COMMUNITY): Payer: Self-pay

## 2022-09-27 ENCOUNTER — Encounter (HOSPITAL_COMMUNITY): Payer: Self-pay

## 2022-09-27 MED ORDER — SEMAGLUTIDE-WEIGHT MANAGEMENT 0.5 MG/0.5ML ~~LOC~~ SOAJ
0.5000 mg | SUBCUTANEOUS | 0 refills | Status: AC
  Filled 2022-09-27: qty 2, 28d supply, fill #0

## 2022-09-27 MED ORDER — SEMAGLUTIDE-WEIGHT MANAGEMENT 0.5 MG/0.5ML ~~LOC~~ SOAJ
0.5000 mg | SUBCUTANEOUS | 0 refills | Status: AC
  Filled 2022-09-27 – 2023-06-26 (×5): qty 2, 28d supply, fill #0

## 2022-09-29 ENCOUNTER — Other Ambulatory Visit (HOSPITAL_COMMUNITY): Payer: Self-pay

## 2022-11-08 ENCOUNTER — Other Ambulatory Visit (HOSPITAL_COMMUNITY): Payer: Self-pay

## 2022-11-22 ENCOUNTER — Other Ambulatory Visit (HOSPITAL_COMMUNITY): Payer: Self-pay

## 2022-11-22 DIAGNOSIS — L821 Other seborrheic keratosis: Secondary | ICD-10-CM | POA: Diagnosis not present

## 2022-11-22 DIAGNOSIS — L910 Hypertrophic scar: Secondary | ICD-10-CM | POA: Diagnosis not present

## 2022-11-22 DIAGNOSIS — L732 Hidradenitis suppurativa: Secondary | ICD-10-CM | POA: Diagnosis not present

## 2022-11-22 MED ORDER — CLINDAMYCIN PHOSPHATE 1 % EX GEL
Freq: Two times a day (BID) | CUTANEOUS | 3 refills | Status: AC
  Filled 2022-11-22: qty 60, 30d supply, fill #0

## 2022-11-22 MED ORDER — SULFAMETHOXAZOLE-TRIMETHOPRIM 800-160 MG PO TABS
1.0000 | ORAL_TABLET | Freq: Two times a day (BID) | ORAL | 2 refills | Status: AC
  Filled 2022-11-22 – 2023-02-06 (×2): qty 60, 30d supply, fill #0
  Filled 2023-06-07: qty 60, 30d supply, fill #1
  Filled 2023-09-26: qty 60, 30d supply, fill #2

## 2022-11-22 MED ORDER — SPIRONOLACTONE 50 MG PO TABS
50.0000 mg | ORAL_TABLET | Freq: Two times a day (BID) | ORAL | 5 refills | Status: DC
  Filled 2022-11-22 – 2023-06-25 (×2): qty 60, 30d supply, fill #0
  Filled 2023-10-25: qty 60, 30d supply, fill #1
  Filled 2023-10-26: qty 60, 30d supply, fill #0

## 2022-12-04 ENCOUNTER — Other Ambulatory Visit (HOSPITAL_COMMUNITY): Payer: Self-pay

## 2022-12-10 ENCOUNTER — Other Ambulatory Visit (HOSPITAL_COMMUNITY): Payer: Self-pay

## 2022-12-12 ENCOUNTER — Other Ambulatory Visit (HOSPITAL_COMMUNITY): Payer: Self-pay

## 2022-12-26 ENCOUNTER — Other Ambulatory Visit (HOSPITAL_COMMUNITY): Payer: Self-pay

## 2022-12-27 ENCOUNTER — Other Ambulatory Visit (HOSPITAL_COMMUNITY): Payer: Self-pay

## 2022-12-27 MED ORDER — AMLODIPINE BESYLATE 10 MG PO TABS
10.0000 mg | ORAL_TABLET | Freq: Every day | ORAL | 0 refills | Status: AC
  Filled 2022-12-27: qty 10, 10d supply, fill #0

## 2023-01-05 ENCOUNTER — Other Ambulatory Visit (HOSPITAL_COMMUNITY): Payer: Self-pay

## 2023-01-05 DIAGNOSIS — F331 Major depressive disorder, recurrent, moderate: Secondary | ICD-10-CM | POA: Diagnosis not present

## 2023-01-05 DIAGNOSIS — I10 Essential (primary) hypertension: Secondary | ICD-10-CM | POA: Diagnosis not present

## 2023-01-05 DIAGNOSIS — Z720 Tobacco use: Secondary | ICD-10-CM | POA: Diagnosis not present

## 2023-01-05 MED ORDER — CITALOPRAM HYDROBROMIDE 20 MG PO TABS
20.0000 mg | ORAL_TABLET | Freq: Every day | ORAL | 1 refills | Status: DC
  Filled 2023-01-05: qty 90, 90d supply, fill #0
  Filled 2023-04-30: qty 90, 90d supply, fill #1

## 2023-01-11 ENCOUNTER — Other Ambulatory Visit (HOSPITAL_COMMUNITY): Payer: Self-pay

## 2023-01-11 MED ORDER — AMLODIPINE BESYLATE 10 MG PO TABS
10.0000 mg | ORAL_TABLET | Freq: Every day | ORAL | 3 refills | Status: DC
  Filled 2023-01-11: qty 30, 30d supply, fill #0
  Filled 2023-02-06: qty 30, 30d supply, fill #1
  Filled 2023-03-10 – 2023-03-22 (×2): qty 30, 30d supply, fill #2
  Filled 2023-04-30: qty 30, 30d supply, fill #3

## 2023-01-26 DIAGNOSIS — L02425 Furuncle of right lower limb: Secondary | ICD-10-CM | POA: Diagnosis not present

## 2023-01-26 DIAGNOSIS — L0232 Furuncle of buttock: Secondary | ICD-10-CM | POA: Diagnosis not present

## 2023-01-26 DIAGNOSIS — L910 Hypertrophic scar: Secondary | ICD-10-CM | POA: Diagnosis not present

## 2023-01-26 DIAGNOSIS — L732 Hidradenitis suppurativa: Secondary | ICD-10-CM | POA: Diagnosis not present

## 2023-02-06 ENCOUNTER — Other Ambulatory Visit (HOSPITAL_COMMUNITY): Payer: Self-pay

## 2023-03-22 ENCOUNTER — Other Ambulatory Visit (HOSPITAL_COMMUNITY): Payer: Self-pay

## 2023-03-22 DIAGNOSIS — R739 Hyperglycemia, unspecified: Secondary | ICD-10-CM | POA: Diagnosis not present

## 2023-03-22 DIAGNOSIS — E88819 Insulin resistance, unspecified: Secondary | ICD-10-CM | POA: Diagnosis not present

## 2023-03-22 DIAGNOSIS — E785 Hyperlipidemia, unspecified: Secondary | ICD-10-CM | POA: Diagnosis not present

## 2023-03-22 DIAGNOSIS — I10 Essential (primary) hypertension: Secondary | ICD-10-CM | POA: Diagnosis not present

## 2023-03-22 DIAGNOSIS — E010 Iodine-deficiency related diffuse (endemic) goiter: Secondary | ICD-10-CM | POA: Diagnosis not present

## 2023-03-22 MED ORDER — NEBIVOLOL HCL 5 MG PO TABS
5.0000 mg | ORAL_TABLET | Freq: Every day | ORAL | 1 refills | Status: DC
  Filled 2023-03-22: qty 90, 90d supply, fill #0
  Filled 2023-06-25: qty 90, 90d supply, fill #1

## 2023-03-23 ENCOUNTER — Other Ambulatory Visit (HOSPITAL_COMMUNITY): Payer: Self-pay

## 2023-03-29 ENCOUNTER — Other Ambulatory Visit (HOSPITAL_COMMUNITY): Payer: Self-pay | Admitting: Family Medicine

## 2023-03-29 DIAGNOSIS — E785 Hyperlipidemia, unspecified: Secondary | ICD-10-CM | POA: Diagnosis not present

## 2023-03-29 DIAGNOSIS — E010 Iodine-deficiency related diffuse (endemic) goiter: Secondary | ICD-10-CM | POA: Diagnosis not present

## 2023-03-29 DIAGNOSIS — R739 Hyperglycemia, unspecified: Secondary | ICD-10-CM | POA: Diagnosis not present

## 2023-03-29 DIAGNOSIS — E040 Nontoxic diffuse goiter: Secondary | ICD-10-CM

## 2023-04-03 DIAGNOSIS — Z124 Encounter for screening for malignant neoplasm of cervix: Secondary | ICD-10-CM | POA: Diagnosis not present

## 2023-04-03 DIAGNOSIS — Z01419 Encounter for gynecological examination (general) (routine) without abnormal findings: Secondary | ICD-10-CM | POA: Diagnosis not present

## 2023-04-03 DIAGNOSIS — Z1231 Encounter for screening mammogram for malignant neoplasm of breast: Secondary | ICD-10-CM | POA: Diagnosis not present

## 2023-04-03 DIAGNOSIS — Z6832 Body mass index (BMI) 32.0-32.9, adult: Secondary | ICD-10-CM | POA: Diagnosis not present

## 2023-04-03 DIAGNOSIS — Z1239 Encounter for other screening for malignant neoplasm of breast: Secondary | ICD-10-CM | POA: Diagnosis not present

## 2023-04-03 DIAGNOSIS — Z01411 Encounter for gynecological examination (general) (routine) with abnormal findings: Secondary | ICD-10-CM | POA: Diagnosis not present

## 2023-04-30 ENCOUNTER — Other Ambulatory Visit (HOSPITAL_COMMUNITY): Payer: Self-pay

## 2023-05-10 ENCOUNTER — Encounter (HOSPITAL_COMMUNITY)

## 2023-05-10 ENCOUNTER — Other Ambulatory Visit (HOSPITAL_COMMUNITY)

## 2023-05-11 ENCOUNTER — Other Ambulatory Visit (HOSPITAL_COMMUNITY)

## 2023-05-16 ENCOUNTER — Ambulatory Visit (HOSPITAL_COMMUNITY): Payer: Self-pay

## 2023-05-16 DIAGNOSIS — N926 Irregular menstruation, unspecified: Secondary | ICD-10-CM | POA: Diagnosis not present

## 2023-05-16 DIAGNOSIS — Z01419 Encounter for gynecological examination (general) (routine) without abnormal findings: Secondary | ICD-10-CM | POA: Diagnosis not present

## 2023-05-17 ENCOUNTER — Ambulatory Visit (HOSPITAL_COMMUNITY): Payer: Self-pay

## 2023-05-22 ENCOUNTER — Ambulatory Visit (HOSPITAL_COMMUNITY)

## 2023-05-23 ENCOUNTER — Ambulatory Visit (HOSPITAL_COMMUNITY)
Admission: RE | Admit: 2023-05-23 | Discharge: 2023-05-23 | Disposition: A | Discharge status: Home / Self Care | Source: Ambulatory Visit | Attending: Family Medicine | Admitting: Family Medicine

## 2023-05-23 ENCOUNTER — Encounter (HOSPITAL_COMMUNITY): Payer: Self-pay

## 2023-05-23 ENCOUNTER — Ambulatory Visit (HOSPITAL_COMMUNITY)

## 2023-05-23 DIAGNOSIS — E040 Nontoxic diffuse goiter: Secondary | ICD-10-CM | POA: Insufficient documentation

## 2023-05-24 ENCOUNTER — Ambulatory Visit (HOSPITAL_COMMUNITY)

## 2023-06-07 ENCOUNTER — Other Ambulatory Visit: Payer: Self-pay

## 2023-06-07 ENCOUNTER — Other Ambulatory Visit (HOSPITAL_COMMUNITY): Payer: Self-pay

## 2023-06-08 ENCOUNTER — Other Ambulatory Visit (HOSPITAL_COMMUNITY): Payer: Self-pay

## 2023-06-08 ENCOUNTER — Other Ambulatory Visit: Payer: Self-pay

## 2023-06-08 MED ORDER — AMLODIPINE BESYLATE 10 MG PO TABS
10.0000 mg | ORAL_TABLET | Freq: Every day | ORAL | 1 refills | Status: DC
  Filled 2023-06-08 – 2023-11-14 (×3): qty 90, 90d supply, fill #0

## 2023-06-09 ENCOUNTER — Other Ambulatory Visit: Payer: Self-pay

## 2023-06-09 ENCOUNTER — Other Ambulatory Visit (HOSPITAL_COMMUNITY): Payer: Self-pay

## 2023-06-09 MED ORDER — CITALOPRAM HYDROBROMIDE 10 MG PO TABS
10.0000 mg | ORAL_TABLET | Freq: Every morning | ORAL | 0 refills | Status: AC
  Filled 2023-06-09: qty 90, 50d supply, fill #0

## 2023-06-09 MED ORDER — AMLODIPINE BESYLATE 10 MG PO TABS
10.0000 mg | ORAL_TABLET | Freq: Every day | ORAL | 0 refills | Status: AC
  Filled 2023-06-09 – 2023-10-11 (×2): qty 30, 30d supply, fill #0

## 2023-06-11 ENCOUNTER — Other Ambulatory Visit (HOSPITAL_COMMUNITY): Payer: Self-pay

## 2023-06-13 ENCOUNTER — Ambulatory Visit (HOSPITAL_COMMUNITY)
Admission: RE | Admit: 2023-06-13 | Discharge: 2023-06-13 | Disposition: A | Discharge status: Home / Self Care | Source: Ambulatory Visit | Attending: Family Medicine | Admitting: Family Medicine

## 2023-06-13 DIAGNOSIS — E040 Nontoxic diffuse goiter: Secondary | ICD-10-CM | POA: Insufficient documentation

## 2023-06-13 MED ORDER — SODIUM IODIDE I-123 7.4 MBQ CAPS
445.0000 | ORAL_CAPSULE | Freq: Once | ORAL | Status: AC
  Administered 2023-06-13: 445 via ORAL

## 2023-06-14 ENCOUNTER — Ambulatory Visit (HOSPITAL_COMMUNITY)
Admission: RE | Admit: 2023-06-14 | Discharge: 2023-06-14 | Disposition: A | Discharge status: Home / Self Care | Source: Ambulatory Visit | Attending: Family Medicine | Admitting: Family Medicine

## 2023-06-14 DIAGNOSIS — E049 Nontoxic goiter, unspecified: Secondary | ICD-10-CM | POA: Diagnosis not present

## 2023-06-25 ENCOUNTER — Other Ambulatory Visit: Payer: Self-pay

## 2023-06-25 ENCOUNTER — Other Ambulatory Visit (HOSPITAL_COMMUNITY): Payer: Self-pay

## 2023-06-26 ENCOUNTER — Other Ambulatory Visit (HOSPITAL_COMMUNITY): Payer: Self-pay

## 2023-06-26 DIAGNOSIS — K602 Anal fissure, unspecified: Secondary | ICD-10-CM | POA: Diagnosis not present

## 2023-06-26 DIAGNOSIS — Z860100 Personal history of colon polyps, unspecified: Secondary | ICD-10-CM | POA: Diagnosis not present

## 2023-06-26 DIAGNOSIS — K6289 Other specified diseases of anus and rectum: Secondary | ICD-10-CM | POA: Diagnosis not present

## 2023-07-20 ENCOUNTER — Other Ambulatory Visit (HOSPITAL_COMMUNITY): Payer: Self-pay

## 2023-07-25 ENCOUNTER — Other Ambulatory Visit (HOSPITAL_COMMUNITY): Payer: Self-pay

## 2023-07-25 MED ORDER — NEBIVOLOL HCL 5 MG PO TABS
5.0000 mg | ORAL_TABLET | Freq: Every day | ORAL | 1 refills | Status: AC
  Filled 2023-09-26 – 2023-12-28 (×2): qty 90, 90d supply, fill #0

## 2023-07-30 ENCOUNTER — Other Ambulatory Visit (HOSPITAL_COMMUNITY): Payer: Self-pay

## 2023-07-30 MED ORDER — VALACYCLOVIR HCL 500 MG PO TABS
500.0000 mg | ORAL_TABLET | Freq: Two times a day (BID) | ORAL | 2 refills | Status: AC
  Filled 2023-07-30 – 2023-11-14 (×2): qty 60, 30d supply, fill #0

## 2023-07-31 ENCOUNTER — Other Ambulatory Visit (HOSPITAL_COMMUNITY): Payer: Self-pay

## 2023-08-02 ENCOUNTER — Other Ambulatory Visit (HOSPITAL_COMMUNITY): Payer: Self-pay

## 2023-08-07 ENCOUNTER — Other Ambulatory Visit (HOSPITAL_COMMUNITY): Payer: Self-pay

## 2023-08-07 MED ORDER — WEGOVY 1 MG/0.5ML ~~LOC~~ SOAJ
1.0000 mg | SUBCUTANEOUS | 0 refills | Status: AC
  Filled 2023-08-07: qty 2, 28d supply, fill #0
  Filled 2023-09-06 – 2023-10-25 (×2): qty 2, 28d supply, fill #1
  Filled 2024-01-22: qty 2, 28d supply, fill #2

## 2023-08-09 ENCOUNTER — Other Ambulatory Visit (HOSPITAL_COMMUNITY): Payer: Self-pay

## 2023-08-10 ENCOUNTER — Other Ambulatory Visit (HOSPITAL_COMMUNITY): Payer: Self-pay

## 2023-08-31 ENCOUNTER — Other Ambulatory Visit (HOSPITAL_COMMUNITY): Payer: Self-pay

## 2023-08-31 DIAGNOSIS — M25522 Pain in left elbow: Secondary | ICD-10-CM | POA: Diagnosis not present

## 2023-08-31 MED ORDER — PREDNISONE 10 MG (21) PO TBPK
ORAL_TABLET | ORAL | 0 refills | Status: AC
  Filled 2023-08-31: qty 21, 6d supply, fill #0

## 2023-09-06 ENCOUNTER — Other Ambulatory Visit (HOSPITAL_COMMUNITY): Payer: Self-pay

## 2023-09-18 ENCOUNTER — Other Ambulatory Visit (HOSPITAL_COMMUNITY): Payer: Self-pay

## 2023-09-25 ENCOUNTER — Other Ambulatory Visit (HOSPITAL_COMMUNITY): Payer: Self-pay

## 2023-09-25 MED ORDER — CITALOPRAM HYDROBROMIDE 20 MG PO TABS
20.0000 mg | ORAL_TABLET | Freq: Every day | ORAL | 1 refills | Status: AC
  Filled 2023-09-25 – 2023-10-26 (×2): qty 90, 90d supply, fill #0

## 2023-09-26 ENCOUNTER — Other Ambulatory Visit (HOSPITAL_COMMUNITY): Payer: Self-pay

## 2023-09-28 ENCOUNTER — Other Ambulatory Visit (HOSPITAL_COMMUNITY): Payer: Self-pay

## 2023-10-11 ENCOUNTER — Other Ambulatory Visit (HOSPITAL_COMMUNITY): Payer: Self-pay

## 2023-10-16 ENCOUNTER — Emergency Department (HOSPITAL_COMMUNITY)

## 2023-10-16 ENCOUNTER — Encounter (HOSPITAL_COMMUNITY): Payer: Self-pay

## 2023-10-16 ENCOUNTER — Other Ambulatory Visit: Payer: Self-pay

## 2023-10-16 ENCOUNTER — Emergency Department (HOSPITAL_COMMUNITY)
Admission: EM | Admit: 2023-10-16 | Discharge: 2023-10-16 | Disposition: A | Discharge status: Home / Self Care | Attending: Physician Assistant | Admitting: Physician Assistant

## 2023-10-16 DIAGNOSIS — Z79899 Other long term (current) drug therapy: Secondary | ICD-10-CM | POA: Diagnosis not present

## 2023-10-16 DIAGNOSIS — M542 Cervicalgia: Secondary | ICD-10-CM | POA: Diagnosis not present

## 2023-10-16 DIAGNOSIS — S39012A Strain of muscle, fascia and tendon of lower back, initial encounter: Secondary | ICD-10-CM | POA: Diagnosis not present

## 2023-10-16 DIAGNOSIS — S199XXA Unspecified injury of neck, initial encounter: Secondary | ICD-10-CM | POA: Diagnosis not present

## 2023-10-16 DIAGNOSIS — S0990XA Unspecified injury of head, initial encounter: Secondary | ICD-10-CM | POA: Diagnosis not present

## 2023-10-16 DIAGNOSIS — S00211A Abrasion of right eyelid and periocular area, initial encounter: Secondary | ICD-10-CM | POA: Diagnosis not present

## 2023-10-16 DIAGNOSIS — Y92410 Unspecified street and highway as the place of occurrence of the external cause: Secondary | ICD-10-CM | POA: Diagnosis not present

## 2023-10-16 DIAGNOSIS — S3992XA Unspecified injury of lower back, initial encounter: Secondary | ICD-10-CM | POA: Diagnosis present

## 2023-10-16 DIAGNOSIS — S0993XA Unspecified injury of face, initial encounter: Secondary | ICD-10-CM | POA: Diagnosis not present

## 2023-10-16 MED ORDER — ACETAMINOPHEN 325 MG PO TABS
650.0000 mg | ORAL_TABLET | Freq: Once | ORAL | Status: AC
  Administered 2023-10-16: 650 mg via ORAL
  Filled 2023-10-16: qty 2

## 2023-10-16 MED ORDER — CYCLOBENZAPRINE HCL 10 MG PO TABS
5.0000 mg | ORAL_TABLET | Freq: Every day | ORAL | 0 refills | Status: AC
  Filled 2023-10-16: qty 7, 7d supply, fill #0

## 2023-10-16 MED ORDER — LIDOCAINE 5 % EX PTCH
1.0000 | MEDICATED_PATCH | CUTANEOUS | 0 refills | Status: AC
  Filled 2023-10-16: qty 30, 30d supply, fill #0
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
## 2023-10-17 ENCOUNTER — Other Ambulatory Visit (HOSPITAL_COMMUNITY): Payer: Self-pay

## 2023-10-25 ENCOUNTER — Other Ambulatory Visit (HOSPITAL_COMMUNITY): Payer: Self-pay

## 2023-10-26 ENCOUNTER — Other Ambulatory Visit (HOSPITAL_COMMUNITY): Payer: Self-pay

## 2023-11-14 ENCOUNTER — Other Ambulatory Visit (HOSPITAL_COMMUNITY): Payer: Self-pay

## 2023-11-15 ENCOUNTER — Other Ambulatory Visit: Payer: Self-pay

## 2023-11-15 ENCOUNTER — Other Ambulatory Visit (HOSPITAL_COMMUNITY): Payer: Self-pay

## 2023-11-16 ENCOUNTER — Encounter (HOSPITAL_COMMUNITY): Payer: Self-pay

## 2023-11-16 ENCOUNTER — Other Ambulatory Visit (HOSPITAL_COMMUNITY): Payer: Self-pay

## 2023-11-26 ENCOUNTER — Other Ambulatory Visit (HOSPITAL_COMMUNITY): Payer: Self-pay

## 2023-11-26 MED ORDER — WEGOVY 1.7 MG/0.75ML ~~LOC~~ SOAJ
1.7000 mg | SUBCUTANEOUS | 1 refills | Status: AC
  Filled 2023-11-26 – 2023-12-28 (×2): qty 3, 28d supply, fill #0
  Filled 2024-01-22: qty 3, 28d supply, fill #1

## 2023-11-27 ENCOUNTER — Other Ambulatory Visit (HOSPITAL_COMMUNITY): Payer: Self-pay

## 2023-11-28 DIAGNOSIS — D259 Leiomyoma of uterus, unspecified: Secondary | ICD-10-CM | POA: Diagnosis not present

## 2023-12-07 ENCOUNTER — Other Ambulatory Visit (HOSPITAL_COMMUNITY): Payer: Self-pay

## 2023-12-28 ENCOUNTER — Other Ambulatory Visit (HOSPITAL_COMMUNITY): Payer: Self-pay

## 2024-01-02 ENCOUNTER — Other Ambulatory Visit (HOSPITAL_COMMUNITY): Payer: Self-pay

## 2024-01-08 ENCOUNTER — Other Ambulatory Visit (HOSPITAL_COMMUNITY): Payer: Self-pay

## 2024-01-08 DIAGNOSIS — L732 Hidradenitis suppurativa: Secondary | ICD-10-CM | POA: Diagnosis not present

## 2024-01-08 MED ORDER — SPIRONOLACTONE 50 MG PO TABS
50.0000 mg | ORAL_TABLET | Freq: Two times a day (BID) | ORAL | 3 refills | Status: AC
  Filled 2024-01-08 – 2024-01-22 (×3): qty 90, 45d supply, fill #0

## 2024-01-08 MED ORDER — SULFAMETHOXAZOLE-TRIMETHOPRIM 800-160 MG PO TABS
1.0000 | ORAL_TABLET | Freq: Two times a day (BID) | ORAL | 3 refills | Status: AC
  Filled 2024-01-08 – 2024-01-22 (×2): qty 90, 45d supply, fill #0

## 2024-01-18 ENCOUNTER — Other Ambulatory Visit (HOSPITAL_COMMUNITY): Payer: Self-pay

## 2024-01-22 ENCOUNTER — Other Ambulatory Visit (HOSPITAL_COMMUNITY): Payer: Self-pay

## 2024-01-23 ENCOUNTER — Other Ambulatory Visit (HOSPITAL_COMMUNITY): Payer: Self-pay

## 2024-01-23 MED ORDER — AMLODIPINE BESYLATE 10 MG PO TABS
10.0000 mg | ORAL_TABLET | Freq: Every day | ORAL | 0 refills | Status: AC
  Filled 2024-01-25: qty 90, 90d supply, fill #0

## 2024-01-25 ENCOUNTER — Other Ambulatory Visit (HOSPITAL_COMMUNITY): Payer: Self-pay

## 2024-01-25 ENCOUNTER — Other Ambulatory Visit: Payer: Self-pay
